# Patient Record
Sex: Male | Born: 1975 | Race: Black or African American | Hispanic: No | Marital: Married | Smoking: Current some day smoker
Health system: Southern US, Community
[De-identification: ages and names within clinical notes are randomized; demographics above are authoritative.]

## PROBLEM LIST (undated history)

## (undated) DIAGNOSIS — F1020 Alcohol dependence, uncomplicated: Secondary | ICD-10-CM

## (undated) DIAGNOSIS — E7800 Pure hypercholesterolemia, unspecified: Secondary | ICD-10-CM

## (undated) DIAGNOSIS — I10 Essential (primary) hypertension: Secondary | ICD-10-CM

## (undated) HISTORY — DX: Alcohol dependence, uncomplicated: F10.20

---

## 1999-03-29 HISTORY — PX: LACERATION REPAIR: SHX5168

## 2002-04-05 ENCOUNTER — Emergency Department (HOSPITAL_COMMUNITY): Admission: EM | Admit: 2002-04-05 | Discharge: 2002-04-05 | Payer: Self-pay | Admitting: Emergency Medicine

## 2003-05-11 ENCOUNTER — Emergency Department (HOSPITAL_COMMUNITY): Admission: AD | Admit: 2003-05-11 | Discharge: 2003-05-11 | Payer: Self-pay | Admitting: Family Medicine

## 2006-02-18 ENCOUNTER — Emergency Department (HOSPITAL_COMMUNITY): Admission: EM | Admit: 2006-02-18 | Discharge: 2006-02-19 | Payer: Self-pay | Admitting: Emergency Medicine

## 2006-02-20 ENCOUNTER — Emergency Department (HOSPITAL_COMMUNITY): Admission: EM | Admit: 2006-02-20 | Discharge: 2006-02-20 | Payer: Self-pay | Admitting: Emergency Medicine

## 2007-07-20 ENCOUNTER — Emergency Department (HOSPITAL_COMMUNITY): Admission: EM | Admit: 2007-07-20 | Discharge: 2007-07-20 | Payer: Self-pay | Admitting: Emergency Medicine

## 2010-12-21 LAB — BASIC METABOLIC PANEL
CO2: 22
Calcium: 9.6
GFR calc Af Amer: >60
Glucose, Bld: 102 — ABNORMAL HIGH
Potassium: 4.2

## 2010-12-21 LAB — URINALYSIS, ROUTINE W REFLEX MICROSCOPIC
Glucose, UA: NEGATIVE
Nitrite: NEGATIVE
Protein, ur: NEGATIVE
Specific Gravity, Urine: 1.009
Urobilinogen, UA: 0.2

## 2010-12-21 LAB — RAPID URINE DRUG SCREEN, HOSP PERFORMED
Barbiturates: NOT DETECTED
Cocaine: POSITIVE — AB
Opiates: NOT DETECTED
Tetrahydrocannabinol: NOT DETECTED

## 2010-12-21 LAB — CBC
Hemoglobin: 14.4
MCHC: 34.5
MCV: 87.1
RBC: 4.78
RDW: 12.7

## 2010-12-21 LAB — DIFFERENTIAL
Basophils Absolute: 0
Basophils Relative: 1
Eosinophils Absolute: 0.2
Neutro Abs: 2.4
Neutrophils Relative %: 42 — ABNORMAL LOW

## 2010-12-21 LAB — POCT CARDIAC MARKERS
CKMB, poc: 2
Myoglobin, poc: 114
Troponin i, poc: <0.05

## 2011-02-21 ENCOUNTER — Emergency Department (HOSPITAL_BASED_OUTPATIENT_CLINIC_OR_DEPARTMENT_OTHER)
Admission: EM | Admit: 2011-02-21 | Discharge: 2011-02-21 | Disposition: A | Discharge status: Home / Self Care | Payer: PRIVATE HEALTH INSURANCE | Attending: Emergency Medicine | Admitting: Emergency Medicine

## 2011-02-21 DIAGNOSIS — B9789 Other viral agents as the cause of diseases classified elsewhere: Secondary | ICD-10-CM | POA: Insufficient documentation

## 2011-02-21 DIAGNOSIS — B349 Viral infection, unspecified: Secondary | ICD-10-CM

## 2011-02-21 DIAGNOSIS — J029 Acute pharyngitis, unspecified: Secondary | ICD-10-CM | POA: Insufficient documentation

## 2011-02-21 HISTORY — DX: Essential (primary) hypertension: I10

## 2011-02-21 NOTE — ED Notes (Signed)
Pt reports a sore throat x 1 week

## 2011-02-21 NOTE — ED Provider Notes (Signed)
History     CSN: [id number] Arrival date & time: 02/21/2011  9:38 AM   First MD Initiated Contact with Patient 02/21/11 [phone]      Chief Complaint  Patient presents with  . Sore Throat    (Consider location/radiation/quality/duration/timing/severity/associated sxs/prior treatment) Patient is a 35 y.o. male presenting with pharyngitis. The history is provided by the patient.  Sore Throat Pertinent negatives include no chest pain, no abdominal pain, no headaches and no shortness of breath.  pt c/o pain bil throat area, bil ears for past few days. Gradual onset. Dull. Constant. occ worse when swallows. No trouble breathing or swallowing. No sinus drainage or pain. No headache. No cough or sob. No neck pain or stiffness. No fever or chills. No known ill contacts.  History reviewed. No pertinent past medical history.  History reviewed. No pertinent past surgical history.  No family history on file.  History  Substance Use Topics  . Smoking status: Not on file  . Smokeless tobacco: Not on file  . Alcohol Use: Not on file      Review of Systems  Constitutional: Negative for fever.  HENT: Negative for rhinorrhea and neck pain.   Eyes: Negative for redness.  Respiratory: Negative for shortness of breath.   Cardiovascular: Negative for chest pain.  Gastrointestinal: Negative for abdominal pain.  Genitourinary: Negative for flank pain.  Musculoskeletal: Negative for back pain.  Skin: Negative for rash.  Neurological: Negative for headaches.  Hematological: Does not bruise/bleed easily.  Psychiatric/Behavioral: Negative for confusion.    Allergies  Review of patient's allergies indicates no known allergies.  Home Medications   Current Outpatient Rx  Name Route Sig Dispense Refill  . OMEGA-3 FATTY ACIDS 1000 MG PO CAPS Oral Take 1 g by mouth daily.      Marland Kitchen HYDROCODONE-ACETAMINOPHEN 5-325 MG PO TABS Oral Take 1 tablet by mouth every 6 (six) hours as needed. For pain     .  IBUPROFEN 200 MG PO TABS Oral Take 400 mg by mouth every 6 (six) hours as needed. For ear and throat pain     . ONE-A-DAY MENS PO Oral Take 1 tablet by mouth.        BP 193/106  Pulse 95  Temp(Src) 98.2 F (36.8 C) (Oral)  Resp 16  Ht 5\' 6"  (1.676 m)  Wt 225 lb (102.059 kg)  BMI 36.32 kg/m2  SpO2 100%  Physical Exam  Nursing note and vitals reviewed. Constitutional: He is oriented to person, place, and time. He appears well-developed and well-nourished. No distress.  HENT:  Head: Atraumatic.       Pharynx erythematous. No exudate. No asymmetric swelling or abscess noted. No trismus. No swelling or tenderness to floor of mouth/submental area.   Eyes: Pupils are equal, round, and reactive to light.  Neck: Neck supple. No tracheal deviation present. No thyromegaly present.       No stiffness or rigidity  Cardiovascular: Normal rate, regular rhythm, normal heart sounds and intact distal pulses.  Exam reveals no gallop and no friction rub.   No murmur heard. Pulmonary/Chest: Effort normal. No accessory muscle usage or stridor. No respiratory distress. He has no rales.  Abdominal: He exhibits no distension. There is no tenderness.       No hsm  Musculoskeletal: Normal range of motion. He exhibits no edema and no tenderness.  Neurological: He is alert and oriented to person, place, and time.  Skin: Skin is warm and dry.  Psychiatric: He has a normal  mood and affect.    ED Course  Procedures (including critical care time)   Labs Reviewed  POCT RAPID STREP A   No results found.   No diagnosis found.    MDM  Strep screen. Recheck bp.   bp improved.       Suzi Roots, MD 02/21/11 [phone]

## 2011-05-05 ENCOUNTER — Ambulatory Visit (INDEPENDENT_AMBULATORY_CARE_PROVIDER_SITE_OTHER): Payer: PRIVATE HEALTH INSURANCE | Admitting: Family Medicine

## 2011-05-05 ENCOUNTER — Ambulatory Visit: Payer: PRIVATE HEALTH INSURANCE

## 2011-05-05 VITALS — BP 120/84 | HR 78 | Temp 98.2°F | Resp 16 | Ht 68.2 in | Wt 242.0 lb

## 2011-05-05 DIAGNOSIS — R7611 Nonspecific reaction to tuberculin skin test without active tuberculosis: Secondary | ICD-10-CM

## 2011-05-05 DIAGNOSIS — R6889 Other general symptoms and signs: Secondary | ICD-10-CM

## 2011-05-05 NOTE — Progress Notes (Signed)
Subjective:    Patient ID: Patrick Levine, male    DOB: 1976-01-25, 36 y.o.   MRN: [id number]  Primary Physician: No primary provider on file.  Chief Complaint: Needs CXR, had positive PPD at [facility]    HPI 36 y.o. y/o [place] American male with history noted below presents needing a CXR. Had a PPD placed on at [facility]. Read as 15 mm induration, and positive. Patient is asymptomatic. No cough, fever, chills, unexplained weight loss, hemoptysis, or fatigue. Never with a positive PPD in the past. Needed the PPD for work. Works in [employer] production pl[employer].     Past Medical History  Diagnosis Date  . Hypertension     Prior to Admission medications   Medication Sig Start Date End Date Taking? Authorizing Provider  fish oil-omega-3 fatty acids 1000 MG capsule Take 1 g by mouth daily.     Yes Historical Provider, MD  Multiple Vitamin (ONE-A-DAY MENS PO) Take 1 tablet by mouth.     Yes Historical Provider, MD  HYDROcodone-acetaminophen (NORCO) 5-325 MG per tablet Take 1 tablet by mouth every 6 (six) hours as needed. For pain    No Historical Provider, MD  ibuprofen (ADVIL,MOTRIN) 200 MG tablet Take 400 mg by mouth every 6 (six) hours as needed. For ear and throat pain    No Historical Provider, MD    No Known Allergies  History   Social History  . Marital Status: Married    Spouse Name: N/A    Number of Children: N/A  . Years of Education: N/A   Social History Main Topics  . Smoking status: Former Smoker -- 0.5 packs/day    Types: Cigarettes  . Smokeless tobacco: Never Used  . Alcohol Use: 1.8 oz/week    3 Cans of beer per week  . Drug Use: No  . Sexually Active: None   Other Topics Concern  . None   Social History Narrative  . None    No family history on file.      Review of Systems  Constitutional: Negative for fever, chills, diaphoresis, activity change, appetite change, fatigue and unexpected weight change.  Respiratory: Negative.     Cardiovascular: Negative for chest pain.       Objective:   Physical Exam  Constitutional: He is oriented to person, place, and time. He appears well-developed and well-nourished. No distress.  HENT:  Head: Normocephalic and atraumatic.  Right Ear: External ear normal.  Left Ear: External ear normal.  Eyes: Conjunctivae are normal. Pupils are equal, round, and reactive to light. Right eye exhibits no discharge. Left eye exhibits no discharge. No scleral icterus.  Neck: Normal range of motion. Neck supple.  Cardiovascular: Normal rate, regular rhythm and normal heart sounds.  Exam reveals no gallop and no friction rub.   No murmur heard. Pulmonary/Chest: Effort normal and breath sounds normal. No respiratory distress. He has no wheezes. He has no rales.  Neurological: He is alert and oriented to person, place, and time.  Skin: He is not diaphoretic.  Psychiatric: He has a normal mood and affect. His behavior is normal. Judgment and thought content normal.   [facility] reading (PRIMARY) by  Dr. Alwyn Ren. Negative. No signs of active TB.    Assessment & Plan:  36 y.o. African American male with a positive PPD, no active TB. - CXR negative, no signs of active disease today in the office - Will refer to TB clinic for evaluation and treatment of positive PPD - RTC  prn  SignedEula Listen, PA-C 05/05/2011 3:02 PM

## 2011-05-05 NOTE — Patient Instructions (Signed)
Tuberculosis Tuberculosis (TB) is a serious infection that can go on for years if not treated. It usually attacks the lungs, but almost any part of the body can be affected. TB germs (bacteria) are easily spread from one person to another through the air. The germs are put into the air when a person with TB coughs or sneezes. You or your child probably caught TB by breathing TB germs into the lungs. This illness generally is not passed to others from clothes, drinking glasses, dishes, handshaking or using the same toilet. TB can be cured with medications. You or your child will have to take the medicines for a long time, often a year or longer. If TB is not treated completely, it can damage the lungs and other parts of the body and may result in death. HOME CARE INSTRUCTIONS   Caregivers are required by law to report all cases of TB to the health department. This helps protect others from getting TB. It also helps individuals get the needed care to cure TB. TREATMENT MUST CONTINUE AS DIRECTED, EVEN IF YOU OR YOUR CHILD NO LONGER FEEL SICK. The health department requires, and enforces this.   IT IS VERY IMPORTANT THAT THE MEDICINE IS TAKEN EXACTLY AS DIRECTED BY THE CAREGIVER. If medicines are skipped or stopped, the infection may not be cured. Bacteria can also become resistant. Remember, you will always have this infection unless treated with correct medications.   Some suggestions to help with remembering to take the medicine include:   Asking someone else, such as a family member or a friend, to help keep track of taking the medicine.   Taking the medicine at the same time every day.   Marking a calendar every time the medicine is taken.   Putting the medicine out at night, for the next day.   Keeping the medicine in a place where the medicine cannot be missed.   Using medication arrangers, which can be purchased at a pharmacy.   Regular follow-up is required to ensure medications are  working. IT IS IMPORTANT TO KEEP ALL APPOINTMENTS. Be sure to tell the caregiver if something is wrong. The follow-up may include:   Weight and temperature measurements and a lung exam.   Chest X-rays to determine the effectiveness of the medications.   Sputum (mucus) samples to determine if TB bacteria are present and if the medications are working.   Until the caregiver says you or your child cannot spread your illness to others (not contagious):   Stay at home. Avoid close contact with others, especially babies and elderly people. They are much more susceptible to this illness.   Cover your mouth and nose with a paper tissue when you cough or sneeze. Dispose of used tissues properly. Masks are not needed.   Wash hands frequently with soap and water.   Do not go back to work or school until the caretaker gives permission. Later, you may work at your same job. If your employer is worried about your illness and your return to work, your caregiver can help eliminate those fears.   Eat a well-balanced diet.   Rest as needed.   Family members, close friends and co-workers should have a TB skin test. They could have caught this illness without getting sick. They may need to take medicine to prevent them from getting TB. People only seen occasionally, probably do not need a TB skin test.   After treatment is finished, regular follow-ups will be needed for  at least 2 years to make sure the illness remains in control.  SEEK MEDICAL CARE IF:   You or your child has new problems (symptoms) that may be caused by the medicine.   Symptoms do not go away or get worse despite taking medications.   Anyone who spent time near you develops symptoms of TB, such as fever, loss of appetite, weight loss, night sweats or cough. They will need to get tested for TB.   You or your child has a cough that does not clear up after 3 or 4 weeks following a cold.   You or your child has an oral temperature above  102 F (38.9 C).   Your baby is older than 3 months with a rectal temperature of 100.5 F (38.1 C) or higher for more than 1 day.   You or your child has continued weight loss despite adequate food intake.  SEEK IMMEDIATE MEDICAL CARE IF:   You or your child has chest pain or cough up blood.   You or your child has difficulty breathing or shortness of breath.   You or your child has a headache, vomiting or neck stiffness.   You or your child has an oral temperature above 102 F (38.9 C), not controlled by medicine.   Your baby is older than 3 months with a rectal temperature of 102 F (38.9 C) or higher.   Your baby is 72 months old or younger with a rectal temperature of 100.4 F (38 C) or higher.  Document Released: 03/11/2000 Document Revised: 10/07/2010 Document Reviewed: 03/16/2008 [facility] Patient Information 2012 [place], [place].

## 2011-12-20 ENCOUNTER — Emergency Department (HOSPITAL_BASED_OUTPATIENT_CLINIC_OR_DEPARTMENT_OTHER)
Admission: EM | Admit: 2011-12-20 | Discharge: 2011-12-20 | Disposition: A | Discharge status: Home / Self Care | Payer: PRIVATE HEALTH INSURANCE | Attending: Emergency Medicine | Admitting: Emergency Medicine

## 2011-12-20 ENCOUNTER — Emergency Department (HOSPITAL_BASED_OUTPATIENT_CLINIC_OR_DEPARTMENT_OTHER): Payer: PRIVATE HEALTH INSURANCE

## 2011-12-20 ENCOUNTER — Encounter (HOSPITAL_BASED_OUTPATIENT_CLINIC_OR_DEPARTMENT_OTHER): Payer: Self-pay | Admitting: Emergency Medicine

## 2011-12-20 DIAGNOSIS — R0602 Shortness of breath: Secondary | ICD-10-CM | POA: Insufficient documentation

## 2011-12-20 DIAGNOSIS — IMO0002 Reserved for concepts with insufficient information to code with codable children: Secondary | ICD-10-CM | POA: Insufficient documentation

## 2011-12-20 DIAGNOSIS — S2231XA Fracture of one rib, right side, initial encounter for closed fracture: Secondary | ICD-10-CM

## 2011-12-20 DIAGNOSIS — R109 Unspecified abdominal pain: Secondary | ICD-10-CM | POA: Insufficient documentation

## 2011-12-20 DIAGNOSIS — R079 Chest pain, unspecified: Secondary | ICD-10-CM | POA: Insufficient documentation

## 2011-12-20 DIAGNOSIS — S2239XA Fracture of one rib, unspecified side, initial encounter for closed fracture: Secondary | ICD-10-CM | POA: Insufficient documentation

## 2011-12-20 LAB — COMPREHENSIVE METABOLIC PANEL
ALT: 39 U/L (ref 0–53)
CO2: 21 mEq/L (ref 19–32)
Calcium: 10 mg/dL (ref 8.4–10.5)
Creatinine, Ser: 1 mg/dL (ref 0.50–1.35)
GFR calc Af Amer: >90 mL/min (ref >90)
GFR calc non Af Amer: >90 mL/min (ref >90)
Glucose, Bld: 124 mg/dL — ABNORMAL HIGH (ref 70–99)
Sodium: 137 mEq/L (ref 135–145)
Total Bilirubin: 0.3 mg/dL (ref 0.3–1.2)

## 2011-12-20 LAB — CBC
Hemoglobin: 14.6 g/dL (ref 13.0–17.0)
MCH: 29.5 pg (ref 26.0–34.0)
MCV: 83.6 fL (ref 78.0–100.0)
RBC: 4.95 MIL/uL (ref 4.22–5.81)

## 2011-12-20 LAB — URINALYSIS, ROUTINE W REFLEX MICROSCOPIC
Bilirubin Urine: NEGATIVE
Specific Gravity, Urine: 1.03 (ref 1.005–1.030)
pH: 6 (ref 5.0–8.0)

## 2011-12-20 LAB — URINE MICROSCOPIC-ADD ON

## 2011-12-20 MED ORDER — OXYCODONE-ACETAMINOPHEN 5-325 MG PO TABS: 1.0000 | ORAL_TABLET | Freq: Four times a day (QID) | ORAL | Status: DC | PRN

## 2011-12-20 MED ORDER — ONDANSETRON HCL 4 MG/2ML IJ SOLN
4.0000 mg | Freq: Once | INTRAMUSCULAR | Status: AC
  Administered 2011-12-20: 4 mg via INTRAVENOUS
  Filled 2011-12-20: qty 2

## 2011-12-20 MED ORDER — IOHEXOL 300 MG/ML  SOLN
100.0000 mL | Freq: Once | INTRAMUSCULAR | Status: AC | PRN
  Administered 2011-12-20: 100 mL via INTRAVENOUS

## 2011-12-20 MED ORDER — MORPHINE SULFATE 4 MG/ML IJ SOLN
4.0000 mg | Freq: Once | INTRAMUSCULAR | Status: AC
  Administered 2011-12-20: 4 mg via INTRAVENOUS
  Filled 2011-12-20: qty 1
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
## 2011-12-26 ENCOUNTER — Telehealth: Payer: Self-pay | Admitting: Family

## 2011-12-26 ENCOUNTER — Encounter: Payer: Self-pay | Admitting: Family

## 2011-12-26 ENCOUNTER — Ambulatory Visit (INDEPENDENT_AMBULATORY_CARE_PROVIDER_SITE_OTHER): Payer: PRIVATE HEALTH INSURANCE | Admitting: Family

## 2011-12-26 VITALS — BP 138/90 | HR 91 | Temp 98.1°F | Resp 16 | Ht 68.0 in | Wt 243.0 lb

## 2011-12-26 DIAGNOSIS — I10 Essential (primary) hypertension: Secondary | ICD-10-CM | POA: Insufficient documentation

## 2011-12-26 DIAGNOSIS — R7611 Nonspecific reaction to tuberculin skin test without active tuberculosis: Secondary | ICD-10-CM | POA: Insufficient documentation

## 2011-12-26 DIAGNOSIS — S2249XA Multiple fractures of ribs, unspecified side, initial encounter for closed fracture: Secondary | ICD-10-CM | POA: Insufficient documentation
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
## 2011-12-26 NOTE — Patient Instructions (Addendum)
You will be contact about your referral to the [facility] health department.  Please let us know if you have not heard back within 1 week about your referral. Please schedule a fasting physical at the front desk for 1 month.
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
## 2011-12-26 NOTE — Telephone Encounter (Signed)
Patrick Levine,  Pt is PPD positive and needs referral to Health Department for treatment of Positive PPD. He is requesting to go to health department in [place]. Could you please help arrange.  I do not see this referral built into [facility]. Thanks.

## 2011-12-26 NOTE — Assessment & Plan Note (Signed)
BP looks ok today off of meds.  Plan to continue low sodium diet and follow up in 1 month for recheck.

## 2011-12-26 NOTE — Assessment & Plan Note (Signed)
Will refer to Health Department for Treatment.

## 2011-12-27 NOTE — Telephone Encounter (Signed)
Called Health Dept   [place]  , spoke to  [employer]    ,called pt he is to call her for instruction phone #'s  Given to pt   [phone] & [phone]  He will call   .

## 2011-12-29 ENCOUNTER — Encounter: Payer: Self-pay | Admitting: *Deleted

## 2011-12-29 ENCOUNTER — Ambulatory Visit (INDEPENDENT_AMBULATORY_CARE_PROVIDER_SITE_OTHER): Payer: PRIVATE HEALTH INSURANCE

## 2011-12-29 DIAGNOSIS — Z23 Encounter for immunization: Secondary | ICD-10-CM

## 2012-01-25 ENCOUNTER — Encounter: Payer: PRIVATE HEALTH INSURANCE | Admitting: Family

## 2012-01-25 DIAGNOSIS — Z0289 Encounter for other administrative examinations: Secondary | ICD-10-CM

## 2012-01-29 ENCOUNTER — Emergency Department (HOSPITAL_COMMUNITY)
Admission: EM | Admit: 2012-01-29 | Discharge: 2012-01-29 | Disposition: A | Discharge status: Home / Self Care | Payer: PRIVATE HEALTH INSURANCE | Attending: Emergency Medicine | Admitting: Emergency Medicine

## 2012-01-29 ENCOUNTER — Encounter (HOSPITAL_COMMUNITY): Payer: Self-pay | Admitting: *Deleted

## 2012-01-29 DIAGNOSIS — F1010 Alcohol abuse, uncomplicated: Secondary | ICD-10-CM | POA: Insufficient documentation

## 2012-01-29 DIAGNOSIS — I10 Essential (primary) hypertension: Secondary | ICD-10-CM | POA: Insufficient documentation

## 2012-01-29 DIAGNOSIS — F10929 Alcohol use, unspecified with intoxication, unspecified: Secondary | ICD-10-CM

## 2012-01-29 DIAGNOSIS — Z87891 Personal history of nicotine dependence: Secondary | ICD-10-CM | POA: Insufficient documentation

## 2012-01-29 LAB — RAPID URINE DRUG SCREEN, HOSP PERFORMED
Amphetamines: NOT DETECTED
Benzodiazepines: NOT DETECTED
Opiates: NOT DETECTED
Tetrahydrocannabinol: NOT DETECTED

## 2012-01-29 MED ORDER — AMMONIA AROMATIC IN INHA
RESPIRATORY_TRACT | Status: AC
  Filled 2012-01-29: qty 10

## 2012-01-29 MED ORDER — SODIUM CHLORIDE 0.9 % IV BOLUS (SEPSIS)
1000.0000 mL | Freq: Once | INTRAVENOUS | Status: AC
  Administered 2012-01-29: 1000 mL via INTRAVENOUS

## 2012-01-29 MED ORDER — ONDANSETRON HCL 4 MG/2ML IJ SOLN
4.0000 mg | Freq: Once | INTRAMUSCULAR | Status: AC
  Administered 2012-01-29: 4 mg via INTRAVENOUS
  Filled 2012-01-29: qty 2

## 2012-01-29 MED ORDER — PANTOPRAZOLE SODIUM 40 MG IV SOLR
40.0000 mg | Freq: Once | INTRAVENOUS | Status: AC
  Administered 2012-01-29: 40 mg via INTRAVENOUS
  Filled 2012-01-29: qty 40
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
## 2012-01-29 NOTE — ED Notes (Signed)
Pt alert and oriented x4. Respirations even and unlabored, bilateral symmetrical rise and fall of chest. Skin warm and dry. In no acute distress. Denies needs.   

## 2012-01-29 NOTE — ED Notes (Signed)
Pt escorted to d/c window. Verbalized understanding d/c instructions. In no acute distress. Ride was coming to pick pt up.
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
## 2012-04-30 ENCOUNTER — Emergency Department (HOSPITAL_BASED_OUTPATIENT_CLINIC_OR_DEPARTMENT_OTHER)
Admission: EM | Admit: 2012-04-30 | Discharge: 2012-04-30 | Disposition: A | Discharge status: Home / Self Care | Payer: PRIVATE HEALTH INSURANCE | Attending: Emergency Medicine | Admitting: Emergency Medicine

## 2012-04-30 ENCOUNTER — Encounter (HOSPITAL_BASED_OUTPATIENT_CLINIC_OR_DEPARTMENT_OTHER): Payer: Self-pay | Admitting: *Deleted

## 2012-04-30 DIAGNOSIS — I10 Essential (primary) hypertension: Secondary | ICD-10-CM | POA: Insufficient documentation

## 2012-04-30 DIAGNOSIS — Z79899 Other long term (current) drug therapy: Secondary | ICD-10-CM | POA: Insufficient documentation

## 2012-04-30 DIAGNOSIS — F17200 Nicotine dependence, unspecified, uncomplicated: Secondary | ICD-10-CM | POA: Insufficient documentation

## 2012-04-30 DIAGNOSIS — J020 Streptococcal pharyngitis: Secondary | ICD-10-CM | POA: Insufficient documentation

## 2012-04-30 MED ORDER — PENICILLIN G BENZATHINE 1200000 UNIT/2ML IM SUSP
1.2000 10*6.[IU] | Freq: Once | INTRAMUSCULAR | Status: AC
  Administered 2012-04-30: 1.2 10*6.[IU] via INTRAMUSCULAR
  Filled 2012-04-30: qty 2

## 2012-04-30 MED ORDER — HYDROCODONE-ACETAMINOPHEN 5-325 MG PO TABS: 2.0000 | ORAL_TABLET | ORAL | Status: DC | PRN

## 2012-04-30 NOTE — ED Provider Notes (Signed)
Medical screening examination/treatment/procedure(s) were performed by non-physician practitioner and as supervising physician I was immediately available for consultation/collaboration.  Gerhard Munch, MD 04/30/12 [phone]

## 2012-04-30 NOTE — ED Provider Notes (Signed)
History     CSN: [id number]  Arrival date & time 04/30/12  1335   First MD Initiated Contact with Patient 04/30/12 1425      Chief Complaint  Patient presents with  . Sore Throat    (Consider location/radiation/quality/duration/timing/severity/associated sxs/prior treatment) Patient is a 37 y.o. male presenting with pharyngitis. The history is provided by the patient. No language interpreter was used.  Sore Throat This is a new problem. The current episode started yesterday. The problem occurs daily. The problem has been unchanged. Associated symptoms include a sore throat. Nothing aggravates the symptoms. He has tried nothing for the symptoms. The treatment provided moderate relief.    Past Medical History  Diagnosis Date  . Hypertension     Past Surgical History  Procedure Date  . Laceration repair 2001    lip laceration--stitches.    Family History  Problem Relation Age of Onset  . Hypertension Father     History  Substance Use Topics  . Smoking status: Former Smoker -- 0.5 packs/day    Types: Cigarettes  . Smokeless tobacco: Never Used  . Alcohol Use: 0.0 oz/week     Comment: "some"      Review of Systems  HENT: Positive for sore throat.     Allergies  Review of patient's allergies indicates no known allergies.  Home Medications   Current Outpatient Rx  Name  Route  Sig  Dispense  Refill  . OMEGA-3 FATTY ACIDS 1000 MG PO CAPS   Oral   Take 1 g by mouth daily.           . IBUPROFEN 200 MG PO TABS   Oral   Take 400 mg by mouth every 6 (six) hours as needed. For ear and throat pain          . ONE-A-DAY MENS PO   Oral   Take 1 tablet by mouth.             BP 158/90  Pulse 117  Temp 100.6 F (38.1 C) (Oral)  Resp 22  SpO2 100%  Physical Exam  Nursing note and vitals reviewed. Constitutional: He is oriented to person, place, and time. He appears well-developed and well-nourished.  HENT:  Head: Normocephalic and atraumatic.  Right  Ear: External ear normal.  Left Ear: External ear normal.       Swollen erythematous pharynx  Eyes: Pupils are equal, round, and reactive to light.  Neck: Normal range of motion. Neck supple.  Cardiovascular: Normal rate and regular rhythm.   Pulmonary/Chest: Effort normal.  Abdominal: Soft.  Musculoskeletal: Normal range of motion.  Neurological: He is alert and oriented to person, place, and time. He has normal reflexes.  Skin: Skin is warm.  Psychiatric: He has a normal mood and affect.    ED Course  Procedures (including critical care time)  Labs Reviewed  RAPID STREP SCREEN - Abnormal; Notable for the following:    Streptococcus, Group A Screen (Direct) POSITIVE (*)     All other components within normal limits   No results found.   No diagnosis found.    MDM   Results for orders placed during the hospital encounter of 04/30/12  RAPID STREP SCREEN      Component Value Range   Streptococcus, Group A Screen (Direct) POSITIVE (*) NEGATIVE   No results found.  bicillian im,  Hydrocodone #10       Lonia Skinner [place], [place] 04/30/12 [phone]

## 2012-04-30 NOTE — ED Notes (Signed)
Sore throat, ear pain, and low grade fever.

## 2012-05-02 ENCOUNTER — Emergency Department (HOSPITAL_BASED_OUTPATIENT_CLINIC_OR_DEPARTMENT_OTHER)
Admission: EM | Admit: 2012-05-02 | Discharge: 2012-05-02 | Disposition: A | Discharge status: Home / Self Care | Payer: PRIVATE HEALTH INSURANCE | Attending: Emergency Medicine | Admitting: Emergency Medicine

## 2012-05-02 ENCOUNTER — Encounter (HOSPITAL_BASED_OUTPATIENT_CLINIC_OR_DEPARTMENT_OTHER): Payer: Self-pay | Admitting: *Deleted

## 2012-05-02 ENCOUNTER — Emergency Department (HOSPITAL_BASED_OUTPATIENT_CLINIC_OR_DEPARTMENT_OTHER): Payer: PRIVATE HEALTH INSURANCE

## 2012-05-02 DIAGNOSIS — Z87891 Personal history of nicotine dependence: Secondary | ICD-10-CM | POA: Insufficient documentation

## 2012-05-02 DIAGNOSIS — Z79899 Other long term (current) drug therapy: Secondary | ICD-10-CM | POA: Insufficient documentation

## 2012-05-02 DIAGNOSIS — I10 Essential (primary) hypertension: Secondary | ICD-10-CM | POA: Insufficient documentation

## 2012-05-02 DIAGNOSIS — J020 Streptococcal pharyngitis: Secondary | ICD-10-CM | POA: Insufficient documentation

## 2012-05-02 LAB — BASIC METABOLIC PANEL
Calcium: 10.1 mg/dL (ref 8.4–10.5)
Creatinine, Ser: 1.2 mg/dL (ref 0.50–1.35)
GFR calc Af Amer: 88 mL/min — ABNORMAL LOW (ref >90)
GFR calc non Af Amer: 76 mL/min — ABNORMAL LOW (ref >90)
Sodium: 140 mEq/L (ref 135–145)

## 2012-05-02 LAB — CBC WITH DIFFERENTIAL/PLATELET
Basophils Absolute: 0 10*3/uL (ref 0.0–0.1)
Basophils Relative: 0 % (ref 0–1)
Eosinophils Absolute: 0 10*3/uL (ref 0.0–0.7)
Eosinophils Relative: 0 % (ref 0–5)
Lymphocytes Relative: 8 % — ABNORMAL LOW (ref 12–46)
MCH: 29 pg (ref 26.0–34.0)
MCHC: 33.2 g/dL (ref 30.0–36.0)
MCV: 87.5 fL (ref 78.0–100.0)
Monocytes Absolute: 1.3 10*3/uL — ABNORMAL HIGH (ref 0.1–1.0)
Platelets: 227 10*3/uL (ref 150–400)
RDW: 13.8 % (ref 11.5–15.5)
WBC: 13.1 10*3/uL — ABNORMAL HIGH (ref 4.0–10.5)

## 2012-05-02 MED ORDER — CLINDAMYCIN HCL 150 MG PO CAPS: 300.0000 mg | ORAL_CAPSULE | Freq: Four times a day (QID) | ORAL | Status: DC

## 2012-05-02 MED ORDER — HYDROCODONE-ACETAMINOPHEN 7.5-325 MG/15ML PO SOLN: 15.0000 mL | Freq: Four times a day (QID) | ORAL | Status: DC | PRN

## 2012-05-02 MED ORDER — IOHEXOL 300 MG/ML  SOLN
76.0000 mL | Freq: Once | INTRAMUSCULAR | Status: AC | PRN
  Administered 2012-05-02: 76 mL via INTRAVENOUS

## 2012-05-02 MED ORDER — DEXAMETHASONE SODIUM PHOSPHATE 10 MG/ML IJ SOLN
10.0000 mg | Freq: Once | INTRAMUSCULAR | Status: AC
  Administered 2012-05-02: 10 mg via INTRAVENOUS
  Filled 2012-05-02: qty 1

## 2012-05-02 MED ORDER — SODIUM CHLORIDE 0.9 % IV SOLN
3.0000 g | Freq: Once | INTRAVENOUS | Status: AC
  Administered 2012-05-02: 3 g via INTRAVENOUS
  Filled 2012-05-02: qty 3

## 2012-05-02 MED ORDER — SODIUM CHLORIDE 0.9 % IV BOLUS (SEPSIS)
1000.0000 mL | Freq: Once | INTRAVENOUS | Status: AC
  Administered 2012-05-02: 1000 mL via INTRAVENOUS

## 2012-05-02 MED ORDER — MORPHINE SULFATE 4 MG/ML IJ SOLN
4.0000 mg | Freq: Once | INTRAMUSCULAR | Status: AC
  Administered 2012-05-02: 4 mg via INTRAVENOUS
  Filled 2012-05-02: qty 1

## 2012-05-02 NOTE — ED Notes (Signed)
Scheduled pt to see dr Suszanne Conners this Friday at 120 pm bring insurance card also have dr Suszanne Conners give extended work note if he feels it is advised to remain out of work longer than Friday feb 8

## 2012-05-02 NOTE — ED Notes (Signed)
Pt seen here on Monday diagnosed with strept throat given Bicillin injection and hydrocodone prescription returns today stating he isnt any better.

## 2012-05-02 NOTE — ED Provider Notes (Signed)
History     CSN: [id number]  Arrival date & time 05/02/12  0801   First MD Initiated Contact with Patient 05/02/12 0801      No chief complaint on file.   (Consider location/radiation/quality/duration/timing/severity/associated sxs/prior treatment) HPI Pt recently diagnosed with strep pharyngitis, given Bicillin IM but symptoms not improving. He has continued to have severe sore throat, R>L, worse with swallowing, hurts to speak and unable to open his mouth.   Past Medical History  Diagnosis Date  . Hypertension     Past Surgical History  Procedure Date  . Laceration repair 2001    lip laceration--stitches.    Family History  Problem Relation Age of Onset  . Hypertension Father     History  Substance Use Topics  . Smoking status: Former Smoker -- 0.5 packs/day    Types: Cigarettes  . Smokeless tobacco: Never Used  . Alcohol Use: 0.0 oz/week     Comment: "some"      Review of Systems All other systems reviewed and are negative except as noted in HPI.   Allergies  Review of patient's allergies indicates no known allergies.  Home Medications   Current Outpatient Rx  Name  Route  Sig  Dispense  Refill  . OMEGA-3 FATTY ACIDS 1000 MG PO CAPS   Oral   Take 1 g by mouth daily.           Marland Kitchen HYDROCODONE-ACETAMINOPHEN 5-325 MG PO TABS   Oral   Take 2 tablets by mouth every 4 (four) hours as needed for pain.   10 tablet   0   . IBUPROFEN 200 MG PO TABS   Oral   Take 400 mg by mouth every 6 (six) hours as needed. For ear and throat pain          . ONE-A-DAY MENS PO   Oral   Take 1 tablet by mouth.             BP 156/91  Pulse 108  Temp 99 F (37.2 C) (Oral)  Resp 20  SpO2 98%  Physical Exam  Nursing note and vitals reviewed. Constitutional: He is oriented to person, place, and time. He appears well-developed and well-nourished.  HENT:  Head: Normocephalic and atraumatic.       Moderate trismus, tonsils 3+ no uvular deviation, swelling and  erythematous tonsillar pillars  Eyes: EOM are normal. Pupils are equal, round, and reactive to light.  Neck: Normal range of motion. Neck supple.  Cardiovascular: Normal rate, normal heart sounds and intact distal pulses.   Pulmonary/Chest: Effort normal and breath sounds normal. No stridor.  Abdominal: Bowel sounds are normal. He exhibits no distension. There is no tenderness.  Musculoskeletal: Normal range of motion. He exhibits no edema and no tenderness.  Lymphadenopathy:    He has cervical adenopathy.  Neurological: He is alert and oriented to person, place, and time. He has normal strength. No cranial nerve deficit or sensory deficit.  Skin: Skin is warm and dry. No rash noted.  Psychiatric: He has a normal mood and affect.    ED Course  Procedures (including critical care time)  Labs Reviewed  CBC WITH DIFFERENTIAL - Abnormal; Notable for the following:    WBC 13.1 (*)     Neutrophils Relative 82 (*)     Neutro Abs 10.7 (*)     Lymphocytes Relative 8 (*)     Monocytes Absolute 1.3 (*)     All other components within normal limits  BASIC  METABOLIC PANEL - Abnormal; Notable for the following:    Glucose, Bld 124 (*)     GFR calc non Af Amer 76 (*)     GFR calc Af Amer 88 (*)     All other components within normal limits   Ct Soft Tissue Neck W Contrast  05/02/2012  *RADIOLOGY REPORT*  Clinical Data: Strep throat.  Peritonsillar abscess.  CT NECK WITH CONTRAST  Technique:  Multidetector CT imaging of the neck was performed with intravenous contrast.  Contrast: 76mL OMNIPAQUE IOHEXOL 300 MG/ML  SOLN  Comparison: None.  Findings: There is extensive soft tissue swelling involving the palatine tonsils bilaterally.  The lingual tonsil is also enlarged bilaterally. Soft tissue swelling extends into the hypopharynx bilaterally.  Multiple small loculated fluid collections are present in the peritonsillar soft tissues on the right.  These all measure up to 1 cm.  These fluid collections  extend down to the hypopharynx on the right.  There is also a 1 cm fluid collection in the left hypopharynx which is consistent with  a small abscess. Adenoid tissue is enlarged symmetrically without abscess.  Mild narrowing of the pharyngeal airway due to soft tissue swelling.  Cervical adenopathy is present left greater than right.  Right level II lymph node measures 15 mm in diameter.  Below this is a 16 mm node.  On the left, there is an 18 mm level II node.  Below this is a 16 x 20 mm node.  There are sub centimeter posterior level V nodes bilaterally.  The larynx is normal.  Lung apices are clear.  Thyroid is normal. Submandibular and parotid glands are normal.  Mucosal thickening in the maxillary sinus bilaterally with probable mucous retention cyst on the left.  Dental caries are present.  IMPRESSION: Extensive soft tissue swelling involving the palatine tonsils and lingual tonsils.  There is soft tissue swelling extending in the hypopharynx.  This is greater on the right than the left.  There are multiple small abscesses on the right and a small 1 cm abscess on the left involving the hypopharynx.  There is cervical adenopathy bilaterally which is most likely reactive due to pharyngitis.   Original Report Authenticated By: Janeece Riggers, M.D.      No diagnosis found.    MDM  Suspect peritonsillar abscess vs worsening phlegmon. Will give IV Unasyn, decadron, IVF and check CT neck.   10:04 AM CT as above, small abscesses, but unlikely to be able to drain. Discussed with Dr. Suszanne Conners who agrees with ED eval, recommends an additional 10mg  of Decadron, oral Clindamycin at home and close follow up in his office.        Charles B. Bernette Mayers, MD 05/02/12 1005

## 2014-02-13 ENCOUNTER — Encounter (HOSPITAL_BASED_OUTPATIENT_CLINIC_OR_DEPARTMENT_OTHER): Payer: Self-pay | Admitting: Emergency Medicine

## 2014-02-13 ENCOUNTER — Emergency Department (HOSPITAL_BASED_OUTPATIENT_CLINIC_OR_DEPARTMENT_OTHER)
Admission: EM | Admit: 2014-02-13 | Discharge: 2014-02-13 | Disposition: A | Discharge status: Home / Self Care | Payer: Managed Care, Other (non HMO) | Attending: Emergency Medicine | Admitting: Emergency Medicine

## 2014-02-13 DIAGNOSIS — I10 Essential (primary) hypertension: Secondary | ICD-10-CM | POA: Insufficient documentation

## 2014-02-13 DIAGNOSIS — Z202 Contact with and (suspected) exposure to infections with a predominantly sexual mode of transmission: Secondary | ICD-10-CM | POA: Diagnosis not present

## 2014-02-13 DIAGNOSIS — Z79899 Other long term (current) drug therapy: Secondary | ICD-10-CM | POA: Insufficient documentation

## 2014-02-13 DIAGNOSIS — Z87891 Personal history of nicotine dependence: Secondary | ICD-10-CM | POA: Diagnosis not present

## 2014-02-13 DIAGNOSIS — IMO0001 Reserved for inherently not codable concepts without codable children: Secondary | ICD-10-CM

## 2014-02-13 DIAGNOSIS — N4889 Other specified disorders of penis: Secondary | ICD-10-CM | POA: Diagnosis present

## 2014-02-13 DIAGNOSIS — Z711 Person with feared health complaint in whom no diagnosis is made: Secondary | ICD-10-CM

## 2014-02-13 DIAGNOSIS — R300 Dysuria: Secondary | ICD-10-CM | POA: Diagnosis not present

## 2014-02-13 DIAGNOSIS — R030 Elevated blood-pressure reading, without diagnosis of hypertension: Secondary | ICD-10-CM

## 2014-02-13 LAB — URINALYSIS, ROUTINE W REFLEX MICROSCOPIC
Bilirubin Urine: NEGATIVE
GLUCOSE, UA: NEGATIVE mg/dL
KETONES UR: NEGATIVE mg/dL
LEUKOCYTES UA: NEGATIVE
NITRITE: NEGATIVE
PH: 6.5 (ref 5.0–8.0)
Protein, ur: NEGATIVE mg/dL
Specific Gravity, Urine: 1.02 (ref 1.005–1.030)
Urobilinogen, UA: 0.2 mg/dL (ref 0.0–1.0)

## 2014-02-13 LAB — URINE MICROSCOPIC-ADD ON

## 2014-02-13 MED ORDER — CEFTRIAXONE SODIUM 250 MG IJ SOLR
250.0000 mg | Freq: Once | INTRAMUSCULAR | Status: AC
  Administered 2014-02-13: 250 mg via INTRAMUSCULAR
  Filled 2014-02-13: qty 250

## 2014-02-13 MED ORDER — AZITHROMYCIN 250 MG PO TABS
1000.0000 mg | ORAL_TABLET | Freq: Once | ORAL | Status: AC
  Administered 2014-02-13: 1000 mg via ORAL
  Filled 2014-02-13: qty 4

## 2014-02-13 MED ORDER — LIDOCAINE HCL (PF) 1 % IJ SOLN
INTRAMUSCULAR | Status: AC
  Administered 2014-02-13: 5 mL
  Filled 2014-02-13: qty 5

## 2014-02-13 NOTE — ED Notes (Signed)
Pt having some irritation at penis on Tuesday.  Pt had encounter on Monday.  No fever or back pain.

## 2014-02-13 NOTE — Discharge Instructions (Signed)
You were treated today for both gonorrhea and Chlamydia. If these tests results positive, you'll be contacted and are then obligated to inform your partner.   Dysuria Dysuria is the medical term for pain with urination. There are many causes for dysuria, but urinary tract infection is the most common. If a urinalysis was performed it can show that there is a urinary tract infection. A urine culture confirms that you or your child is sick. You will need to follow up with a healthcare provider because:  If a urine culture was done you will need to know the culture results and treatment recommendations.  If the urine culture was positive, you or your child will need to be put on antibiotics or know if the antibiotics prescribed are the right antibiotics for your urinary tract infection.  If the urine culture is negative (no urinary tract infection), then other causes may need to be explored or antibiotics need to be stopped. Today laboratory work may have been done and there does not seem to be an infection. If cultures were done they will take at least 24 to 48 hours to be completed. Today x-rays may have been taken and they read as normal. No cause can be found for the problems. The x-rays may be re-read by a radiologist and you will be contacted if additional findings are made. You or your child may have been put on medications to help with this problem until you can see your primary caregiver. If the problems get better, see your primary caregiver if the problems return. If you were given antibiotics (medications which kill germs), take all of the mediations as directed for the full course of treatment.  If laboratory work was done, you need to find the results. Leave a telephone number where you can be reached. If this is not possible, make sure you find out how you are to get test results. HOME CARE INSTRUCTIONS   Drink lots of fluids. For adults, drink eight, 8 ounce glasses of clear juice or  water a day. For children, replace fluids as suggested by your caregiver.  Empty the bladder often. Avoid holding urine for long periods of time.  After a bowel movement, women should cleanse front to back, using each tissue only once.  Empty your bladder before and after sexual intercourse.  Take all the medicine given to you until it is gone. You may feel better in a few days, but TAKE ALL MEDICINE.  Avoid caffeine, tea, alcohol and carbonated beverages, because they tend to irritate the bladder.  In men, alcohol may irritate the prostate.  Only take over-the-counter or prescription medicines for pain, discomfort, or fever as directed by your caregiver.  If your caregiver has given you a follow-up appointment, it is very important to keep that appointment. Not keeping the appointment could result in a chronic or permanent injury, pain, and disability. If there is any problem keeping the appointment, you must call back to this facility for assistance. SEEK IMMEDIATE MEDICAL CARE IF:   Back pain develops.  A fever develops.  There is nausea (feeling sick to your stomach) or vomiting (throwing up).  Problems are no better with medications or are getting worse. MAKE SURE YOU:   Understand these instructions.  Will watch your condition.  Will get help right away if you are not doing well or get worse. Document Released: 12/11/2003 Document Revised: 06/06/2011 Document Reviewed: 10/18/2007 [facility] Patient Information 2015 [place], [place]. This information is not intended to  replace advice given to you by your health care provider. Make sure you discuss any questions you have with your health care provider. Sexually Transmitted Disease A sexually transmitted disease (STD) is a disease or infection that may be passed (transmitted) from person to person, usually during sexual activity. This may happen by way of saliva, semen, blood, vaginal mucus, or urine. Common STDs include:    Gonorrhea.   Chlamydia.   Syphilis.   HIV and AIDS.   Genital herpes.   Hepatitis B and C.   Trichomonas.   Human papillomavirus (HPV).   Pubic lice.   Scabies.  Mites.  Bacterial vaginosis. WHAT ARE CAUSES OF STDs? An STD may be caused by bacteria, a virus, or parasites. STDs are often transmitted during sexual activity if one person is infected. However, they may also be transmitted through nonsexual means. STDs may be transmitted after:   Sexual intercourse with an infected person.   Sharing sex toys with an infected person.   Sharing needles with an infected person or using unclean piercing or tattoo needles.  Having intimate contact with the genitals, mouth, or rectal areas of an infected person.   Exposure to infected fluids during birth. WHAT ARE THE SIGNS AND SYMPTOMS OF STDs? Different STDs have different symptoms. Some people may not have any symptoms. If symptoms are present, they may include:   Painful or bloody urination.   Pain in the pelvis, abdomen, vagina, anus, throat, or eyes.   A skin rash, itching, or irritation.  Growths, ulcerations, blisters, or sores in the genital and anal areas.  Abnormal vaginal discharge with or without bad odor.   Penile discharge in men.   Fever.   Pain or bleeding during sexual intercourse.   Swollen glands in the groin area.   Yellow skin and eyes (jaundice). This is seen with hepatitis.   Swollen testicles.  Infertility.  Sores and blisters in the mouth. HOW ARE STDs DIAGNOSED? To make a diagnosis, your health care provider may:   Take a medical history.   Perform a physical exam.   Take a sample of any discharge to examine.  Swab the throat, cervix, opening to the penis, rectum, or vagina for testing.  Test a sample of your first morning urine.   Perform blood tests.   Perform a Pap test, if this applies.   Perform a colposcopy.   Perform a laparoscopy.   HOW ARE STDs TREATED? Treatment depends on the STD. Some STDs may be treated but not cured.   Chlamydia, gonorrhea, trichomonas, and syphilis can be cured with antibiotic medicine.   Genital herpes, hepatitis, and HIV can be treated, but not cured, with prescribed medicines. The medicines lessen symptoms.   Genital warts from HPV can be treated with medicine or by freezing, burning (electrocautery), or surgery. Warts may come back.   HPV cannot be cured with medicine or surgery. However, abnormal areas may be removed from the cervix, vagina, or vulva.   If your diagnosis is confirmed, your recent sexual partners need treatment. This is true even if they are symptom-free or have a negative culture or evaluation. They should not have sex until their health care providers say it is okay. HOW CAN I REDUCE MY RISK OF GETTING AN STD? Take these steps to reduce your risk of getting an STD:  Use latex condoms, dental dams, and water-soluble lubricants during sexual activity. Do not use petroleum jelly or oils.  Avoid having multiple sex partners.  Do not  have sex with someone who has other sex partners.  Do not have sex with anyone you do not know or who is at high risk for an STD.  Avoid risky sex practices that can break your skin.  Do not have sex if you have open sores on your mouth or skin.  Avoid drinking too much alcohol or taking illegal drugs. Alcohol and drugs can affect your judgment and put you in a vulnerable position.  Avoid engaging in oral and anal sex acts.  Get vaccinated for HPV and hepatitis. If you have not received these vaccines in the past, talk to your health care provider about whether one or both might be right for you.   If you are at risk of being infected with HIV, it is recommended that you take a prescription medicine daily to prevent HIV infection. This is called pre-exposure prophylaxis (PrEP). You are considered at risk if:  You are a man who has  sex with other men (MSM).  You are a heterosexual man or woman and are sexually active with more than one partner.  You take drugs by injection.  You are sexually active with a partner who has HIV.  Talk with your health care provider about whether you are at high risk of being infected with HIV. If you choose to begin PrEP, you should first be tested for HIV. You should then be tested every 3 months for as long as you are taking PrEP.  WHAT SHOULD I DO IF I THINK I HAVE AN STD?  See your health care provider.   Tell your sexual partner(s). They should be tested and treated for any STDs.  Do not have sex until your health care provider says it is okay. WHEN SHOULD I GET IMMEDIATE MEDICAL CARE? Contact your health care provider right away if:   You have severe abdominal pain.  You are a man and notice swelling or pain in your testicles.  You are a woman and notice swelling or pain in your vagina. Document Released: 06/04/2002 Document Revised: 03/19/2013 Document Reviewed: 10/02/2012 [facility] Patient Information 2015 [place], [place]. This information is not intended to replace advice given to you by your health care provider. Make sure you discuss any questions you have with your health care provider.

## 2014-02-13 NOTE — ED Provider Notes (Signed)
CSN: [id number]     Arrival date & time 02/13/14  1046 History   First MD Initiated Contact with Patient 02/13/14 1256     Chief Complaint  Patient presents with  . Penis Pain     (Consider location/radiation/quality/duration/timing/severity/associated sxs/prior Treatment) HPI Comments: This is a 38 year old male who presents to the emergency department with concerns of sexually transmitted infection. Patient reports 4 days ago he had unprotected intercourse with a new partner. The next day he started to develop some irritation at the tip of his penis and a burning sensation when urinating. Denies penile discharge or swelling. Denies testicular pain or swelling. Denies abdominal pain, nausea, vomiting or fevers.  Patient is a 38 y.o. male presenting with penile pain. The history is provided by the patient.  Penis Pain    Past Medical History  Diagnosis Date  . Hypertension    Past Surgical History  Procedure Laterality Date  . Laceration repair  2001    lip laceration--stitches.   Family History  Problem Relation Age of Onset  . Hypertension Father    History  Substance Use Topics  . Smoking status: Former Smoker -- 0.50 packs/day    Types: Cigarettes  . Smokeless tobacco: Never Used  . Alcohol Use: 0.0 oz/week     Comment: "some"    Review of Systems  Genitourinary: Positive for dysuria and penile pain.  All other systems reviewed and are negative.     Allergies  Review of patient's allergies indicates no known allergies.  Home Medications   Prior to Admission medications   Medication Sig Start Date End Date Taking? Authorizing Provider  hydrochlorothiazide (HYDRODIURIL) 12.5 MG tablet Take 12.5 mg by mouth daily.   Yes Historical Provider, MD  Multiple Vitamin (ONE-A-DAY MENS PO) Take 1 tablet by mouth.     Yes Historical Provider, MD  fish oil-omega-3 fatty acids 1000 MG capsule Take 1 g by mouth daily.      Historical Provider, MD   BP 171/105 mmHg   Pulse 80  Temp(Src) 98.9 F (37.2 C) (Oral)  Resp 19  Ht 5\' 7"  (1.702 m)  Wt 245 lb (111.131 kg)  BMI 38.36 kg/m2  SpO2 99% Physical Exam  Constitutional: He is oriented to person, place, and time. He appears well-developed and well-nourished. No distress.  HENT:  Head: Normocephalic and atraumatic.  Eyes: Conjunctivae and EOM are normal.  Neck: Normal range of motion. Neck supple.  Cardiovascular: Normal rate, regular rhythm and normal heart sounds.   Pulmonary/Chest: Effort normal and breath sounds normal.  Genitourinary: Right testis shows no mass, no swelling and no tenderness. Left testis shows no mass, no swelling and no tenderness. No penile erythema or penile tenderness. No discharge found.  Musculoskeletal: Normal range of motion. He exhibits no edema.  Neurological: He is alert and oriented to person, place, and time.  Skin: Skin is warm and dry.  Psychiatric: He has a normal mood and affect. His behavior is normal.  Nursing note and vitals reviewed.   ED Course  Procedures (including critical care time) Labs Review Labs Reviewed  URINALYSIS, ROUTINE W REFLEX MICROSCOPIC - Abnormal; Notable for the following:    Hgb urine dipstick SMALL (*)    All other components within normal limits  GC/CHLAMYDIA PROBE AMP  URINE MICROSCOPIC-ADD ON    Imaging Review No results found.   EKG Interpretation None      MDM   Final diagnoses:  Concern about STD in male without diagnosis  Dysuria  Elevated blood pressure   Patient presenting with concerns of sexually transmitted disease. GC/Chlamydia cultures pending. Patient prophylactically treated with Rocephin and azithromycin. Urinalysis negative for infection. Regarding blood pressure, patient reports he did not take his blood pressure medications this morning. He states he has been at home and will take them when he returns home. Asymptomatic high blood pressure. Stable for discharge. F/u with PCP. Return precautions  given. Patient states understanding of treatment care plan and is agreeable.  Carman Ching, PA-C 02/13/14 [place], MD 02/13/14 [phone]

## 2014-02-14 LAB — GC/CHLAMYDIA PROBE AMP
CT Probe RNA: NEGATIVE
GC PROBE AMP APTIMA: NEGATIVE

## 2014-04-11 ENCOUNTER — Encounter: Payer: Self-pay | Admitting: Family

## 2014-04-11 ENCOUNTER — Ambulatory Visit (INDEPENDENT_AMBULATORY_CARE_PROVIDER_SITE_OTHER): Payer: Managed Care, Other (non HMO) | Admitting: Family

## 2014-04-11 ENCOUNTER — Ambulatory Visit (HOSPITAL_BASED_OUTPATIENT_CLINIC_OR_DEPARTMENT_OTHER)
Admission: RE | Admit: 2014-04-11 | Discharge: 2014-04-11 | Disposition: A | Discharge status: Home / Self Care | Payer: Managed Care, Other (non HMO) | Source: Ambulatory Visit | Attending: Family | Admitting: Family

## 2014-04-11 ENCOUNTER — Telehealth: Payer: Self-pay | Admitting: Family

## 2014-04-11 ENCOUNTER — Ambulatory Visit: Payer: PRIVATE HEALTH INSURANCE | Admitting: Family

## 2014-04-11 VITALS — BP 130/78 | HR 71 | Temp 98.1°F | Resp 18 | Ht 67.75 in | Wt 243.6 lb

## 2014-04-11 DIAGNOSIS — N368 Other specified disorders of urethra: Secondary | ICD-10-CM | POA: Insufficient documentation

## 2014-04-11 DIAGNOSIS — Z91030 Bee allergy status: Secondary | ICD-10-CM | POA: Insufficient documentation

## 2014-04-11 DIAGNOSIS — R7611 Nonspecific reaction to tuberculin skin test without active tuberculosis: Secondary | ICD-10-CM | POA: Diagnosis present

## 2014-04-11 DIAGNOSIS — N399 Disorder of urinary system, unspecified: Secondary | ICD-10-CM

## 2014-04-11 DIAGNOSIS — R300 Dysuria: Secondary | ICD-10-CM

## 2014-04-11 DIAGNOSIS — I10 Essential (primary) hypertension: Secondary | ICD-10-CM

## 2014-04-11 DIAGNOSIS — Z91038 Other insect allergy status: Secondary | ICD-10-CM

## 2014-04-11 LAB — BASIC METABOLIC PANEL
BUN: 14 mg/dL (ref 6–23)
CALCIUM: 9.8 mg/dL (ref 8.4–10.5)
CO2: 27 mEq/L (ref 19–32)
Chloride: 106 mEq/L (ref 96–112)
Creatinine, Ser: 1.12 mg/dL (ref 0.40–1.50)
GFR: 93.87 mL/min (ref >60.00)
GLUCOSE: 107 mg/dL — AB (ref 70–99)
POTASSIUM: 4.1 meq/L (ref 3.5–5.1)
Sodium: 138 mEq/L (ref 135–145)

## 2014-04-11 MED ORDER — HYDROCHLOROTHIAZIDE 12.5 MG PO TABS: 12.5000 mg | ORAL_TABLET | Freq: Every day | ORAL | Status: DC

## 2014-04-11 MED ORDER — EPINEPHRINE 0.3 MG/0.3ML IJ SOAJ: 0.3000 mg | Freq: Once | INTRAMUSCULAR | Status: DC

## 2014-04-11 NOTE — Progress Notes (Signed)
Pre visit review using our clinic review tool, if applicable. No additional management support is needed unless otherwise documented below in the visit note. 

## 2014-04-11 NOTE — Assessment & Plan Note (Signed)
Stable on hctz, continue same, obtain bmet.

## 2014-04-11 NOTE — Patient Instructions (Signed)
Please complete lab work prior to leaving. Complete chest x ray on the first floor. You will be contacted about your referral to the health department. Schedule a complete physical at the front desk.

## 2014-04-11 NOTE — Assessment & Plan Note (Signed)
Will obtain urine culture and urine GC/Chlamydia

## 2014-04-11 NOTE — Telephone Encounter (Signed)
Could you please initiate referral to the health department for PPD +.  Pt is having a chest x ray today and this referral is not built into epic.

## 2014-04-11 NOTE — Assessment & Plan Note (Signed)
Advised pt to keep epi pen on hand (refill provided + coupon). Also advised pt to keep liquid benadryl on hand.

## 2014-04-11 NOTE — Progress Notes (Signed)
Subjective:    Patient ID: Patrick Levine, male    DOB: June 26, 1975, 39 y.o.   MRN: [id number]  HPI  HTN- Patient is currently maintained on the following medications for blood pressure HCTZ Patient reports good compliance with blood pressure medications. Patient denies chest pain, shortness of breath or swelling. Last 3 blood pressure readings in our office are as follows:  BP Readings from Last 3 Encounters:  04/11/14 130/78  02/13/14 171/105  05/02/12 148/87    Reports that he stopped alcohol. Was drinking beer and moonshine.   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   Reports some mild urethral irritation without discharge.  Had one new partner but reports + condom use.      Review of Systems See HPI  Past Medical History  Diagnosis Date  . Hypertension     History   Social History  . Marital Status: Married    Spouse Name: N/A    Number of Children: 4  . Years of Education: N/A   Occupational History  . MENTAL HEALTH/CASE MANAGER    Social History Main Topics  . Smoking status: Current Some Day Smoker -- 0.50 packs/day    Types: Cigarettes  . Smokeless tobacco: Never Used  . Alcohol Use: 0.0 oz/week     Comment: "some"  . Drug Use: No     Comment: remote use of cocaine and marijuana  . Sexual Activity: Not on file   Other Topics Concern  . 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   Married          Past Surgical History  Procedure Laterality Date  . Laceration repair  2001    lip laceration--stitches.    Family History  Problem Relation Age of Onset  . Hypertension Father     Allergies  Allergen Reactions  . Bee Venom Anaphylaxis     Current Outpatient Prescriptions on File Prior to Visit  Medication Sig Dispense Refill  . fish oil-omega-3 fatty acids 1000 MG capsule Take 1 g by mouth daily.      . Multiple Vitamin (ONE-A-DAY MENS PO) Take 1 tablet by mouth.       No current facility-administered medications on file prior to visit.    BP 130/78 mmHg  Pulse 71  Temp(Src) 98.1 F (36.7 C) (Oral)  Resp 18  Ht 5' 7.75" (1.721 m)  Wt 243 lb 9.6 oz (110.496 kg)  BMI 37.31 kg/m2  SpO2 99%        Objective:   Physical Exam  Constitutional: He is oriented to person, place, and time. He appears well-developed and well-nourished. No distress.  HENT:  Head: Normocephalic and atraumatic.  Cardiovascular: Normal rate and regular rhythm.   No murmur heard. Pulmonary/Chest: Effort normal and breath sounds normal. No respiratory distress. He has no wheezes. He has no rales. He exhibits no tenderness.  Musculoskeletal: He exhibits no edema.  Lymphadenopathy:    He has no cervical adenopathy.  Neurological: He is alert and oriented to person, place, and time.  Psychiatric: He has a normal mood and affect. His behavior is normal. Judgment and thought content normal.  Assessment & Plan:

## 2014-04-11 NOTE — Assessment & Plan Note (Signed)
Repeat CXR today, refer to health departmetn.

## 2014-04-12 LAB — GC/CHLAMYDIA PROBE AMP, URINE
CHLAMYDIA, SWAB/URINE, PCR: NEGATIVE
GC Probe Amp, Urine: NEGATIVE

## 2014-04-13 LAB — URINE CULTURE
Colony Count: NO GROWTH
Organism ID, Bacteria: NO GROWTH

## 2014-04-15 NOTE — Telephone Encounter (Signed)
Referral faxed to health department.  Patient notified to call health department and stated understanding.    eal

## 2014-06-11 ENCOUNTER — Encounter: Payer: Managed Care, Other (non HMO) | Admitting: Family

## 2014-06-12 ENCOUNTER — Telehealth: Payer: Self-pay | Admitting: Family

## 2014-06-12 NOTE — Telephone Encounter (Signed)
Yes pls

## 2014-06-12 NOTE — Telephone Encounter (Signed)
Pt was no show for CPE on 06/11/14- rescheduled to 07/18/14. Charge no show?

## 2014-06-27 ENCOUNTER — Telehealth: Payer: Self-pay | Admitting: Family

## 2014-06-27 NOTE — Telephone Encounter (Signed)
Pre visit letter sent  °

## 2014-07-07 ENCOUNTER — Emergency Department (HOSPITAL_BASED_OUTPATIENT_CLINIC_OR_DEPARTMENT_OTHER)
Admission: EM | Admit: 2014-07-07 | Discharge: 2014-07-07 | Disposition: A | Discharge status: Home / Self Care | Payer: Managed Care, Other (non HMO) | Attending: Emergency Medicine | Admitting: Emergency Medicine

## 2014-07-07 ENCOUNTER — Encounter (HOSPITAL_BASED_OUTPATIENT_CLINIC_OR_DEPARTMENT_OTHER): Payer: Self-pay | Admitting: Emergency Medicine

## 2014-07-07 DIAGNOSIS — Z720 Tobacco use: Secondary | ICD-10-CM | POA: Diagnosis not present

## 2014-07-07 DIAGNOSIS — I10 Essential (primary) hypertension: Secondary | ICD-10-CM | POA: Insufficient documentation

## 2014-07-07 DIAGNOSIS — J029 Acute pharyngitis, unspecified: Secondary | ICD-10-CM | POA: Diagnosis present

## 2014-07-07 DIAGNOSIS — J020 Streptococcal pharyngitis: Secondary | ICD-10-CM | POA: Diagnosis not present

## 2014-07-07 DIAGNOSIS — Z79899 Other long term (current) drug therapy: Secondary | ICD-10-CM | POA: Insufficient documentation

## 2014-07-07 LAB — RAPID STREP SCREEN (MED CTR MEBANE ONLY): Streptococcus, Group A Screen (Direct): POSITIVE — AB

## 2014-07-07 MED ORDER — PENICILLIN G BENZATHINE 1200000 UNIT/2ML IM SUSP
1.2000 10*6.[IU] | Freq: Once | INTRAMUSCULAR | Status: AC
  Administered 2014-07-07: 1.2 10*6.[IU] via INTRAMUSCULAR
  Filled 2014-07-07: qty 2

## 2014-07-07 NOTE — ED Provider Notes (Signed)
CSN: [id number]     Arrival date & time 07/07/14  0537 History   First MD Initiated Contact with Patient 07/07/14 [phone]     Chief Complaint  Patient presents with  . Sore Throat     (Consider location/radiation/quality/duration/timing/severity/associated sxs/prior Treatment) Patient is a 39 y.o. male presenting with pharyngitis. The history is provided by the patient.  Sore Throat This is a new problem. The current episode started 2 days ago. The problem occurs constantly. The problem has been rapidly worsening. Pertinent negatives include no chest pain, no abdominal pain and no shortness of breath. The symptoms are aggravated by swallowing. Nothing relieves the symptoms. He has tried nothing for the symptoms. The treatment provided no relief.    Past Medical History  Diagnosis Date  . Hypertension    Past Surgical History  Procedure Laterality Date  . Laceration repair  2001    lip laceration--stitches.   Family History  Problem Relation Age of Onset  . Hypertension Father    History  Substance Use Topics  . Smoking status: Current Some Day Smoker -- 0.50 packs/day    Types: Cigarettes  . Smokeless tobacco: Never Used  . Alcohol Use: 0.0 oz/week     Comment: "some"    Review of Systems  Respiratory: Negative for shortness of breath.   Cardiovascular: Negative for chest pain.  Gastrointestinal: Negative for abdominal pain.  All other systems reviewed and are negative.     Allergies  Bee venom  Home Medications   Prior to Admission medications   Medication Sig Start Date End Date Taking? Authorizing Provider  EPINEPHrine (EPIPEN 2-PAK) 0.3 mg/0.3 mL IJ SOAJ injection Inject 0.3 mLs (0.3 mg total) into the muscle once. 04/11/14   Debbrah Alar, NP  fish oil-omega-3 fatty acids 1000 MG capsule Take 1 g by mouth daily.      Historical Provider, MD  hydrochlorothiazide (HYDRODIURIL) 12.5 MG tablet Take 1 tablet (12.5 mg total) by mouth daily. 04/11/14   Debbrah Alar, NP  Multiple Vitamin (ONE-A-DAY MENS PO) Take 1 tablet by mouth.      Historical Provider, MD   BP 168/100 mmHg  Pulse 88  Temp(Src) 98.6 F (37 C) (Oral)  Resp 20  Ht 5\' 7"  (1.702 m)  Wt 232 lb (105.235 kg)  BMI 36.33 kg/m2  SpO2 100% Physical Exam  Constitutional: He is oriented to person, place, and time. He appears well-developed and well-nourished. No distress.  HENT:  Head: Normocephalic and atraumatic.  There is erythema and exudates on the oropharynx.  Neck: Normal range of motion. Neck supple.  Cardiovascular: Normal rate, regular rhythm and normal heart sounds.   Pulmonary/Chest: Effort normal and breath sounds normal. No respiratory distress. He has no wheezes.  Abdominal: Soft. Bowel sounds are normal.  Musculoskeletal: Normal range of motion. He exhibits no edema.  Lymphadenopathy:    He has cervical adenopathy.  Neurological: He is alert and oriented to person, place, and time.  Skin: Skin is warm and dry. He is not diaphoretic.  Nursing note and vitals reviewed.   ED Course  Procedures (including critical care time) Labs Review Labs Reviewed  RAPID STREP SCREEN - Abnormal; Notable for the following:    Streptococcus, Group A Screen (Direct) POSITIVE (*)    All other components within normal limits    Imaging Review No results found.   EKG Interpretation None      MDM   Final diagnoses:  None    Will treat with bicillin.  Ibuprofen prn.    Veryl Speak, MD 07/07/14 [phone]

## 2014-07-07 NOTE — ED Notes (Signed)
Pt reports severe throat pain onset 1 day ago . States grand daughter has same

## 2014-07-07 NOTE — Discharge Instructions (Signed)
Ibuprofen 600 mg every six hours as needed for pain or fever.  Return to the ER if you develop any new and concerning symptoms.   Strep Throat Strep throat is an infection of the throat caused by a bacteria named Streptococcus pyogenes. Your health care provider may call the infection streptococcal "tonsillitis" or "pharyngitis" depending on whether there are signs of inflammation in the tonsils or back of the throat. Strep throat is most common in children aged 5-15 years during the cold months of the year, but it can occur in people of any age during any season. This infection is spread from person to person (contagious) through coughing, sneezing, or other close contact. SIGNS AND SYMPTOMS   Fever or chills.  Painful, swollen, red tonsils or throat.  Pain or difficulty when swallowing.  White or yellow spots on the tonsils or throat.  Swollen, tender lymph nodes or "glands" of the neck or under the jaw.  Red rash all over the body (rare). DIAGNOSIS  Many different infections can cause the same symptoms. A test must be done to confirm the diagnosis so the right treatment can be given. A "rapid strep test" can help your health care provider make the diagnosis in a few minutes. If this test is not available, a light swab of the infected area can be used for a throat culture test. If a throat culture test is done, results are usually available in a day or two. TREATMENT  Strep throat is treated with antibiotic medicine. HOME CARE INSTRUCTIONS   Gargle with 1 tsp of salt in 1 cup of warm water, 3-4 times per day or as needed for comfort.  Family members who also have a sore throat or fever should be tested for strep throat and treated with antibiotics if they have the strep infection.  Make sure everyone in your household washes their hands well.  Do not share food, drinking cups, or personal items that could cause the infection to spread to others.  You may need to eat a soft food  diet until your sore throat gets better.  Drink enough water and fluids to keep your urine clear or pale yellow. This will help prevent dehydration.  Get plenty of rest.  Stay home from school, day care, or work until you have been on antibiotics for 24 hours.  Take medicines only as directed by your health care provider.  Take your antibiotic medicine as directed by your health care provider. Finish it even if you start to feel better. SEEK MEDICAL CARE IF:   The glands in your neck continue to enlarge.  You develop a rash, cough, or earache.  You cough up green, yellow-brown, or bloody sputum.  You have pain or discomfort not controlled by medicines.  Your problems seem to be getting worse rather than better.  You have a fever. SEEK IMMEDIATE MEDICAL CARE IF:   You develop any new symptoms such as vomiting, severe headache, stiff or painful neck, chest pain, shortness of breath, or trouble swallowing.  You develop severe throat pain, drooling, or changes in your voice.  You develop swelling of the neck, or the skin on the neck becomes red and tender.  You develop signs of dehydration, such as fatigue, dry mouth, and decreased urination.  You become increasingly sleepy, or you cannot wake up completely. MAKE SURE YOU:  Understand these instructions.  Will watch your condition.  Will get help right away if you are not doing well or get worse.  Document Released: 03/11/2000 Document Revised: 07/29/2013 Document Reviewed: 05/13/2010 [facility] Patient Information 2015 [place], [place]. This information is not intended to replace advice given to you by your health care provider. Make sure you discuss any questions you have with your health care provider.

## 2014-07-17 ENCOUNTER — Telehealth: Payer: Self-pay | Admitting: *Deleted

## 2014-07-17 NOTE — Telephone Encounter (Signed)
Patient answered phone for pre-visit call, but was unable to talk at the time.  Patient stated he will call back later today.

## 2014-07-18 ENCOUNTER — Encounter: Payer: Managed Care, Other (non HMO) | Admitting: Family

## 2014-07-18 DIAGNOSIS — Z0289 Encounter for other administrative examinations: Secondary | ICD-10-CM

## 2015-02-08 ENCOUNTER — Emergency Department (HOSPITAL_BASED_OUTPATIENT_CLINIC_OR_DEPARTMENT_OTHER)
Admission: EM | Admit: 2015-02-08 | Discharge: 2015-02-08 | Disposition: A | Discharge status: Home / Self Care | Payer: Managed Care, Other (non HMO) | Attending: Emergency Medicine | Admitting: Emergency Medicine

## 2015-02-08 ENCOUNTER — Encounter (HOSPITAL_BASED_OUTPATIENT_CLINIC_OR_DEPARTMENT_OTHER): Payer: Self-pay | Admitting: *Deleted

## 2015-02-08 DIAGNOSIS — J029 Acute pharyngitis, unspecified: Secondary | ICD-10-CM | POA: Diagnosis present

## 2015-02-08 DIAGNOSIS — Z79899 Other long term (current) drug therapy: Secondary | ICD-10-CM | POA: Insufficient documentation

## 2015-02-08 DIAGNOSIS — Z9114 Patient's other noncompliance with medication regimen: Secondary | ICD-10-CM

## 2015-02-08 DIAGNOSIS — Z9119 Patient's noncompliance with other medical treatment and regimen: Secondary | ICD-10-CM | POA: Diagnosis not present

## 2015-02-08 DIAGNOSIS — F17210 Nicotine dependence, cigarettes, uncomplicated: Secondary | ICD-10-CM | POA: Diagnosis not present

## 2015-02-08 DIAGNOSIS — I10 Essential (primary) hypertension: Secondary | ICD-10-CM | POA: Insufficient documentation

## 2015-02-08 LAB — RAPID STREP SCREEN (MED CTR MEBANE ONLY): Streptococcus, Group A Screen (Direct): NEGATIVE

## 2015-02-08 MED ORDER — PENICILLIN G BENZATHINE 1200000 UNIT/2ML IM SUSP
1.2000 10*6.[IU] | Freq: Once | INTRAMUSCULAR | Status: AC
  Administered 2015-02-08: 1.2 10*6.[IU] via INTRAMUSCULAR
  Filled 2015-02-08: qty 2

## 2015-02-08 MED ORDER — LIDOCAINE VISCOUS 2 % MT SOLN
20.0000 mL | Freq: Once | OROMUCOSAL | Status: AC
  Administered 2015-02-08: 20 mL via OROMUCOSAL
  Filled 2015-02-08: qty 30

## 2015-02-08 MED ORDER — LIDOCAINE VISCOUS 2 % MT SOLN: 15.0000 mL | OROMUCOSAL | Status: DC | PRN

## 2015-02-08 MED ORDER — HYDROCHLOROTHIAZIDE 12.5 MG PO TABS: 12.5000 mg | ORAL_TABLET | Freq: Every day | ORAL | Status: DC

## 2015-02-08 NOTE — ED Provider Notes (Signed)
CSN: [id number]     Arrival date & time 02/08/15  1231 History   First MD Initiated Contact with Patient 02/08/15 1301     Chief Complaint  Patient presents with  . Sore Throat     (Consider location/radiation/quality/duration/timing/severity/associated sxs/prior Treatment) HPI  Blood pressure 155/93, pulse 96, temperature 99.9 F (37.7 C), temperature source Oral, resp. rate 20, height 5\' 6"  (1.676 m), weight 250 lb (113.399 kg), SpO2 99 %.  Patrick Levine is a 39 y.o. male complaining of severe sore throat radiating to right ear, tactile fever and myalgia onset 2 days ago. Patient has been taking over-the-counter medications with little relief. Patient denies drooling, cough, shortness of breath, rash. States he gets strep every several months. Has been noncompliant with his hypertension medications for "a while." He denies chest pain, shortness of breath, nausea, vomiting, abdominal pain, headache, change in vision, dysarthria, ataxia.    Past Medical History  Diagnosis Date  . Hypertension    Past Surgical History  Procedure Laterality Date  . Laceration repair  2001    lip laceration--stitches.   Family History  Problem Relation Age of Onset  . Hypertension Father    Social History  Substance Use Topics  . Smoking status: Current Some Day Smoker -- 0.50 packs/day    Types: Cigarettes  . Smokeless tobacco: Never Used  . Alcohol Use: 0.0 oz/week     Comment: 6-8 drinks/day    Review of Systems  10 systems reviewed and found to be negative, except as noted in the HPI.   Allergies  Bee venom  Home Medications   Prior to Admission medications   Medication Sig Start Date End Date Taking? Authorizing Provider  EPINEPHrine (EPIPEN 2-PAK) 0.3 mg/0.3 mL IJ SOAJ injection Inject 0.3 mLs (0.3 mg total) into the muscle once. 04/11/14  Yes Debbrah Alar, NP  fish oil-omega-3 fatty acids 1000 MG capsule Take 1 g by mouth daily.     Yes Historical Provider, MD   Multiple Vitamin (ONE-A-DAY MENS PO) Take 1 tablet by mouth.     Yes Historical Provider, MD  hydrochlorothiazide (HYDRODIURIL) 12.5 MG tablet Take 1 tablet (12.5 mg total) by mouth daily. 02/08/15   Raylyn Speckman, PA-C  lidocaine (XYLOCAINE) 2 % solution Use as directed 15 mLs in the mouth or throat every 3 (three) hours as needed. 02/08/15   Rosalea Withrow, PA-C   BP 152/91 mmHg  Pulse 97  Temp(Src) 99.9 F (37.7 C) (Oral)  Resp 22  Ht 5\' 6"  (1.676 m)  Wt 250 lb (113.399 kg)  BMI 40.37 kg/m2  SpO2 100% Physical Exam  Constitutional: He is oriented to person, place, and time. He appears well-developed and well-nourished. No distress.  HENT:  Head: Normocephalic.  Mouth/Throat: Oropharyngeal exudate present.  No drooling or stridor.   3+ tonsillar hypertrophy with exudate  Soft palate rises symmetrically. No TTP or induration under tongue.   No tenderness to palpation of frontal or bilateral maxillary sinuses.  No mucosal edema in the nares.  Bilateral tympanic membranes with normal architecture and good light reflex.    Eyes: Conjunctivae and EOM are normal. Pupils are equal, round, and reactive to light.  Neck: Normal range of motion. Neck supple.  Cardiovascular: Normal rate, regular rhythm and intact distal pulses.   Pulmonary/Chest: Effort normal and breath sounds normal. No stridor. No respiratory distress. He has no wheezes. He has no rales. He exhibits no tenderness.  Abdominal: Soft. Bowel sounds are normal. He exhibits no distension and  no mass. There is no tenderness. There is no rebound and no guarding.  Musculoskeletal: Normal range of motion.  Lymphadenopathy:    He has cervical adenopathy.  Neurological: He is alert and oriented to person, place, and time.  Psychiatric: He has a normal mood and affect.  Nursing note and vitals reviewed.   ED Course  Procedures (including critical care time) Labs Review Labs Reviewed  RAPID STREP SCREEN (NOT AT  [facility])  CULTURE, GROUP A STREP    Imaging Review No results found. I have personally reviewed and evaluated these images and lab results as part of my medical decision-making.   EKG Interpretation None      MDM   Final diagnoses:  Pharyngitis  Uncontrolled hypertension  History of medication noncompliance    Filed Vitals:   02/08/15 1237 02/08/15 1423  BP: 155/93 152/91  Pulse: 96 97  Temp: 99.9 F (37.7 C)   TempSrc: Oral   Resp: 20 22  Height: 5\' 6"  (1.676 m)   Weight: 250 lb (113.399 kg)   SpO2: 99% 100%    Medications  penicillin g benzathine (BICILLIN LA) 1200000 UNIT/2ML injection 1.2 Million Units (1.2 Million Units Intramuscular Given 02/08/15 1406)  lidocaine (XYLOCAINE) 2 % viscous mouth solution 20 mL (20 mLs Mouth/Throat Given 02/08/15 1407)    Patrick Levine is 39 y.o. male presenting with sore throat tactile fever and right ear pain. Physical exam is consistent with a strep pharyngitis. Patient given the option of amoxicillin or Bicillin and he chooses the shot. Patient states that he is noncompliant with his hypertension medications. We've had an extensive discussion on the importance of taking his high blood pressure medications daily as directed, we've also had an extensive discussion on smoking cessation. Encouraged patient to follow closely with his primary care physician.   Evaluation does not show pathology that would require ongoing emergent intervention or inpatient treatment. Pt is hemodynamically stable and mentating appropriately. Discussed findings and plan with patient/guardian, who agrees with care plan. All questions answered. Return precautions discussed and outpatient follow up given.   Discharge Medication List as of 02/08/2015  2:01 PM    START taking these medications   Details  lidocaine (XYLOCAINE) 2 % solution Use as directed 15 mLs in the mouth or throat every 3 (three) hours as needed., Starting 02/08/2015, Until  Discontinued, Print             Monico Blitz, PA-C 02/08/15 1639  Harvel Quale, MD 02/08/15 2140

## 2015-02-08 NOTE — ED Notes (Signed)
Sore throat since Friday and right ear pain

## 2015-02-08 NOTE — Discharge Instructions (Signed)
For pain control please take ibuprofen (also known as Motrin or Advil) 800mg  (this is normally 4 over the counter pills) 3 times a day  for 5 days. Take with food to minimize stomach irritation.  Please follow with your primary care doctor in the next 2 days for a check-up. They must obtain records for further management.   Do not hesitate to return to the Emergency Department for any new, worsening or concerning symptoms.    Hypertension Hypertension, commonly called high blood pressure, is when the force of blood pumping through your arteries is too strong. Your arteries are the blood vessels that carry blood from your heart throughout your body. A blood pressure reading consists of a higher number over a lower number, such as 110/72. The higher number (systolic) is the pressure inside your arteries when your heart pumps. The lower number (diastolic) is the pressure inside your arteries when your heart relaxes. Ideally you want your blood pressure below 120/80. Hypertension forces your heart to work harder to pump blood. Your arteries may become narrow or stiff. Having untreated or uncontrolled hypertension can cause heart attack, stroke, kidney disease, and other problems. RISK FACTORS Some risk factors for high blood pressure are controllable. Others are not.  Risk factors you cannot control include:   Race. You may be at higher risk if you are African American.  Age. Risk increases with age.  Gender. Men are at higher risk than women before age 3 years. After age 62, women are at higher risk than men. Risk factors you can control include:  Not getting enough exercise or physical activity.  Being overweight.  Getting too much fat, sugar, calories, or salt in your diet.  Drinking too much alcohol. SIGNS AND SYMPTOMS Hypertension does not usually cause signs or symptoms. Extremely high blood pressure (hypertensive crisis) may cause headache, anxiety, shortness of breath, and  nosebleed. DIAGNOSIS To check if you have hypertension, your health care provider will measure your blood pressure while you are seated, with your arm held at the level of your heart. It should be measured at least twice using the same arm. Certain conditions can cause a difference in blood pressure between your right and left arms. A blood pressure reading that is higher than normal on one occasion does not mean that you need treatment. If it is not clear whether you have high blood pressure, you may be asked to return on a different day to have your blood pressure checked again. Or, you may be asked to monitor your blood pressure at home for 1 or more weeks. TREATMENT Treating high blood pressure includes making lifestyle changes and possibly taking medicine. Living a healthy lifestyle can help lower high blood pressure. You may need to change some of your habits. Lifestyle changes may include:  Following the DASH diet. This diet is high in fruits, vegetables, and whole grains. It is low in salt, red meat, and added sugars.  Keep your sodium intake below 2,300 mg per day.  Getting at least 30-45 minutes of aerobic exercise at least 4 times per week.  Losing weight if necessary.  Not smoking.  Limiting alcoholic beverages.  Learning ways to reduce stress. Your health care provider may prescribe medicine if lifestyle changes are not enough to get your blood pressure under control, and if one of the following is true:  You are 40-38 years of age and your systolic blood pressure is above 140.  You are 19 years of age or older,  and your systolic blood pressure is above 150.  Your diastolic blood pressure is above 90.  You have diabetes, and your systolic blood pressure is over [id number] or your diastolic blood pressure is over 90.  You have kidney disease and your blood pressure is above 140/90.  You have heart disease and your blood pressure is above 140/90. Your personal target blood  pressure may vary depending on your medical conditions, your age, and other factors. HOME CARE INSTRUCTIONS  Have your blood pressure rechecked as directed by your health care provider.   Take medicines only as directed by your health care provider. Follow the directions carefully. Blood pressure medicines must be taken as prescribed. The medicine does not work as well when you skip doses. Skipping doses also puts you at risk for problems.  Do not smoke.   Monitor your blood pressure at home as directed by your health care provider. SEEK MEDICAL CARE IF:   You think you are having a reaction to medicines taken.  You have recurrent headaches or feel dizzy.  You have swelling in your ankles.  You have trouble with your vision. SEEK IMMEDIATE MEDICAL CARE IF:  You develop a severe headache or confusion.  You have unusual weakness, numbness, or feel faint.  You have severe chest or abdominal pain.  You vomit repeatedly.  You have trouble breathing. MAKE SURE YOU:   Understand these instructions.  Will watch your condition.  Will get help right away if you are not doing well or get worse.   This information is not intended to replace advice given to you by your health care provider. Make sure you discuss any questions you have with your health care provider.   Document Released: 03/14/2005 Document Revised: 07/29/2014 Document Reviewed: 01/04/2013 Elsevier Interactive Patient Education [employer].

## 2015-02-11 LAB — CULTURE, GROUP A STREP

## 2015-03-24 ENCOUNTER — Telehealth: Payer: Self-pay | Admitting: Family

## 2015-03-24 NOTE — Telephone Encounter (Signed)
Patient Name: Patrick Levine  DOB: 11-Jul-1975    Initial Comment Caller states c/o numbness, tingling and weakness in both hands   Nurse Assessment  Nurse: Orvan Seen, RN, Jacquilin Date/Time (Eastern Time): 03/24/2015 9:39:41 AM  Confirm and document reason for call. If symptomatic, describe symptoms. ---Caller states c/o numbness, tingling and weakness in both hands/finger tips- Started 4+ weeks ago  Has the patient traveled out of the country within the last 30 days? ---No  Does the patient have any new or worsening symptoms? ---Yes  Will a triage be completed? ---Yes  Related visit to physician within the last 2 weeks? ---No  Does the PT have any chronic conditions? (i.e. diabetes, asthma, etc.) ---No  Is this a behavioral health or substance abuse call? ---No     Guidelines    Guideline Title Affirmed Question Affirmed Notes  Neurologic Deficit [1] Weakness of arm / hand, or leg / foot AND [2] is a chronic symptom (recurrent or ongoing AND present > 4 weeks)    Final Disposition User   See PCP When Office is Open (within 3 days) Orvan Seen, RN, Jacquilin    Comments  Appointment made [phone] with Dr. Birdie Riddle on 12/28   Referrals  REFERRED TO PCP OFFICE   Disagree/Comply: Leta Baptist

## 2015-03-25 ENCOUNTER — Encounter: Payer: Self-pay | Admitting: Family Medicine

## 2015-03-25 ENCOUNTER — Ambulatory Visit (INDEPENDENT_AMBULATORY_CARE_PROVIDER_SITE_OTHER): Payer: Managed Care, Other (non HMO) | Admitting: Family Medicine

## 2015-03-25 ENCOUNTER — Encounter (INDEPENDENT_AMBULATORY_CARE_PROVIDER_SITE_OTHER): Payer: Self-pay

## 2015-03-25 VITALS — BP 152/90 | HR 93 | Temp 98.0°F | Resp 16 | Ht 66.0 in | Wt 247.0 lb

## 2015-03-25 DIAGNOSIS — R208 Other disturbances of skin sensation: Secondary | ICD-10-CM | POA: Diagnosis not present

## 2015-03-25 DIAGNOSIS — I10 Essential (primary) hypertension: Secondary | ICD-10-CM | POA: Diagnosis not present

## 2015-03-25 DIAGNOSIS — R200 Anesthesia of skin: Secondary | ICD-10-CM

## 2015-03-25 LAB — CBC WITH DIFFERENTIAL/PLATELET
BASOS PCT: 0.4 % (ref 0.0–3.0)
Basophils Absolute: 0 10*3/uL (ref 0.0–0.1)
EOS ABS: 0.1 10*3/uL (ref 0.0–0.7)
EOS PCT: 1.3 % (ref 0.0–5.0)
HCT: 42.3 % (ref 39.0–52.0)
Hemoglobin: 14.1 g/dL (ref 13.0–17.0)
LYMPHS ABS: 2.1 10*3/uL (ref 0.7–4.0)
Lymphocytes Relative: 20 % (ref 12.0–46.0)
MCHC: 33.3 g/dL (ref 30.0–36.0)
MCV: 88.5 fl (ref 78.0–100.0)
MONO ABS: 0.7 10*3/uL (ref 0.1–1.0)
Monocytes Relative: 6.4 % (ref 3.0–12.0)
NEUTROS PCT: 71.9 % (ref 43.0–77.0)
Neutro Abs: 7.4 10*3/uL (ref 1.4–7.7)
PLATELETS: 315 10*3/uL (ref 150.0–400.0)
RBC: 4.78 Mil/uL (ref 4.22–5.81)
RDW: 14 % (ref 11.5–15.5)
WBC: 10.3 10*3/uL (ref 4.0–10.5)

## 2015-03-25 LAB — HEMOGLOBIN A1C: HEMOGLOBIN A1C: 5.7 % (ref 4.6–6.5)

## 2015-03-25 LAB — TSH: TSH: 3.61 u[IU]/mL (ref 0.35–4.50)

## 2015-03-25 LAB — HEPATIC FUNCTION PANEL
ALK PHOS: 60 U/L (ref 39–117)
ALT: 14 U/L (ref 0–53)
AST: 16 U/L (ref 0–37)
Albumin: 4.2 g/dL (ref 3.5–5.2)
BILIRUBIN TOTAL: 0.2 mg/dL (ref 0.2–1.2)
Bilirubin, Direct: 0 mg/dL (ref 0.0–0.3)
Total Protein: 7.8 g/dL (ref 6.0–8.3)

## 2015-03-25 LAB — BASIC METABOLIC PANEL
BUN: 16 mg/dL (ref 6–23)
CHLORIDE: 105 meq/L (ref 96–112)
CO2: 22 mEq/L (ref 19–32)
Calcium: 9.9 mg/dL (ref 8.4–10.5)
Creatinine, Ser: 0.98 mg/dL (ref 0.40–1.50)
GFR: 108.97 mL/min (ref >60.00)
Glucose, Bld: 100 mg/dL — ABNORMAL HIGH (ref 70–99)
POTASSIUM: 4 meq/L (ref 3.5–5.1)
Sodium: 137 mEq/L (ref 135–145)

## 2015-03-25 LAB — B12 AND FOLATE PANEL
FOLATE: 4.3 ng/mL — AB (ref >5.9)
VITAMIN B 12: 252 pg/mL (ref 211–911)

## 2015-03-25 MED ORDER — MELOXICAM 15 MG PO TABS: 15.0000 mg | ORAL_TABLET | Freq: Every day | ORAL | Status: DC

## 2015-03-25 MED ORDER — LOSARTAN POTASSIUM 50 MG PO TABS: 50.0000 mg | ORAL_TABLET | Freq: Every day | ORAL | Status: DC

## 2015-03-25 NOTE — Assessment & Plan Note (Signed)
New.  Pt's sxs are consistent w/ carpal tunnel.  Start schedule NSAIDs.  OTC wrist splints for nightly use.  Check labs to r/o metabolic cause of numbness.  If no improvement, pt will need referral for nerve conduction studies.  Reviewed supportive care and red flags that should prompt return.  Pt expressed understanding and is in agreement w/ plan.

## 2015-03-25 NOTE — Assessment & Plan Note (Signed)
Deteriorated.  Pt's BP is not well controlled despite starting HCTZ (per ER) last month.  He is having unpleasant side effects from HCTZ (urinary pressure and frequency).  Based on side effects and that med is not effective in controlling BP, will switch to losartan.  Reviewed lifestyle modifications that will also improve BP.  Pt to f/u w/ PCP for additional management.  Pt expressed understanding and is in agreement w/ plan.

## 2015-03-25 NOTE — Progress Notes (Signed)
Pre visit review using our clinic review tool, if applicable. No additional management support is needed unless otherwise documented below in the visit note. 

## 2015-03-25 NOTE — Patient Instructions (Signed)
Follow up in 3-4 weeks to recheck BP and carpal tunnel We'll notify you of your lab results and make any changes if needed Try and make healthy food choices and get regular exercise STOP the HCTZ START the losartan daily for the blood pressure Start the Mobic once daily for the pain and inflammation Buy OTC wrist braces to wear at night to improve carpal tunnel Call with any questions or concerns Hang in there!!!

## 2015-03-25 NOTE — Progress Notes (Signed)
   Subjective:    Patient ID: Patrick Levine, male    DOB: 1975-12-22, 39 y.o.   MRN: [id number]  HPI HTN- chronic problem, was started on HCTZ last month after ER visit.  Pt reports some visual changes when looking extreme L/R.  Pt reports urinary 'pressure' since starting medication over a month ago.  Increased urination.  Hand numbness- bilateral, 1st 3 fingers.  sxs started 'a little more than a month' ago.  No weakness of grip.  sxs will wax and wane.  Works w/ Emergency planning/management officer regularly.  sxs are worse 1st thing in the AM.     Review of Systems For ROS see HPI     Objective:   Physical Exam  Constitutional: He is oriented to person, place, and time. He appears well-developed and well-nourished. No distress.  obese  HENT:  Head: Normocephalic and atraumatic.  Eyes: Conjunctivae and EOM are normal. Pupils are equal, round, and reactive to light.  Neck: Normal range of motion. Neck supple. No thyromegaly present.  Cardiovascular: Normal rate, regular rhythm, normal heart sounds and intact distal pulses.   No murmur heard. Pulmonary/Chest: Effort normal and breath sounds normal. No respiratory distress.  Abdominal: Soft. Bowel sounds are normal. He exhibits no distension.  Musculoskeletal: He exhibits no edema.  Lymphadenopathy:    He has no cervical adenopathy.  Neurological: He is alert and oriented to person, place, and time. He has normal reflexes. No cranial nerve deficit.  Faintly + phalen's bilaterally  Skin: Skin is warm and dry.  Psychiatric: He has a normal mood and affect. His behavior is normal.  Vitals reviewed.         Assessment & Plan:

## 2015-04-22 ENCOUNTER — Telehealth: Payer: Self-pay | Admitting: Family

## 2015-04-22 ENCOUNTER — Encounter: Payer: Self-pay | Admitting: Family

## 2015-04-22 ENCOUNTER — Ambulatory Visit (INDEPENDENT_AMBULATORY_CARE_PROVIDER_SITE_OTHER): Payer: Managed Care, Other (non HMO) | Admitting: Family

## 2015-04-22 VITALS — BP 160/108 | HR 75 | Temp 98.1°F | Resp 18 | Ht 68.0 in | Wt 253.0 lb

## 2015-04-22 DIAGNOSIS — R7611 Nonspecific reaction to tuberculin skin test without active tuberculosis: Secondary | ICD-10-CM

## 2015-04-22 DIAGNOSIS — I10 Essential (primary) hypertension: Secondary | ICD-10-CM

## 2015-04-22 DIAGNOSIS — R208 Other disturbances of skin sensation: Secondary | ICD-10-CM | POA: Diagnosis not present

## 2015-04-22 DIAGNOSIS — R200 Anesthesia of skin: Secondary | ICD-10-CM

## 2015-04-22 LAB — BASIC METABOLIC PANEL
BUN: 13 mg/dL (ref 6–23)
CALCIUM: 9.6 mg/dL (ref 8.4–10.5)
CO2: 26 mEq/L (ref 19–32)
CREATININE: 1.05 mg/dL (ref 0.40–1.50)
Chloride: 105 mEq/L (ref 96–112)
GFR: 100.59 mL/min (ref >60.00)
Glucose, Bld: 108 mg/dL — ABNORMAL HIGH (ref 70–99)
Potassium: 4 mEq/L (ref 3.5–5.1)
SODIUM: 138 meq/L (ref 135–145)

## 2015-04-22 MED ORDER — FOLIC ACID 400 MCG PO TABS: 400.0000 ug | ORAL_TABLET | Freq: Every day | ORAL | Status: DC

## 2015-04-22 MED ORDER — EPINEPHRINE 0.3 MG/0.3ML IJ SOAJ: 0.3000 mg | Freq: Once | INTRAMUSCULAR | Status: DC

## 2015-04-22 MED ORDER — AMLODIPINE BESYLATE 5 MG PO TABS: 5.0000 mg | ORAL_TABLET | Freq: Every day | ORAL | Status: DC

## 2015-04-22 MED FILL — AMLODIPINE BESYLATE 5 MG TA: 5 | 30 days supply | Qty: 30 | Fill #0

## 2015-04-22 NOTE — Telephone Encounter (Signed)
Could you please initiate referral to [facility] for treatment of + PPD? Thank you

## 2015-04-22 NOTE — Patient Instructions (Signed)
Please complete lab work prior to leaving. Add amlodipine 5mg  once daily as well as folic acid supplement (OTC). You will be contacted about your referral to the [facility] for treatment (Positive PPD). Follow up in 1 month.

## 2015-04-22 NOTE — Assessment & Plan Note (Signed)
Advised pt to follow through with referral to Health dept. 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

## 2015-04-22 NOTE — Progress Notes (Signed)
   Subjective:    Patient ID: Patrick Levine, male    DOB: 1975-06-08, 40 y.o.   MRN: [id number]  HPI  [name] is a 40 yr old male who presents today for follow up.  1) HTN- He saw Dr. Birdie Riddle on 03/25/15.  He had urinary frequency on hctz at that time and hctz was changed to losartan.  Patient reports that in the morning he had dizziness initially after beginning losartan.  However dizziness has resolved. Reports that this occurred 3 weeks ago.  BP Readings from Last 3 Encounters:  04/22/15 160/108  03/25/15 152/90  02/08/15 152/91   2) Numbness- last visit it was recommended that he start scheduled NSAIDS, otc wrist splints for nightly use. Folate level was low last visit. He has not yet started folic acid.    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 Though reports he was contacted.   Review of Systems  Respiratory: Negative for shortness of breath.   Cardiovascular: Negative for chest pain.       Objective:   Physical Exam  Constitutional: He is oriented to person, place, and time. He appears well-developed and well-nourished. No distress.  HENT:  Head: Normocephalic and atraumatic.  Cardiovascular: Normal rate and regular rhythm.   No murmur heard. Pulmonary/Chest: Effort normal and breath sounds normal. No respiratory distress. He has no wheezes. He has no rales.  Musculoskeletal: He exhibits no edema.  Neurological: He is alert and oriented to person, place, and time.  Skin: Skin is warm and dry.  Psychiatric: He has a normal mood and affect. His behavior is normal. Thought content normal.          Assessment & Plan:

## 2015-04-22 NOTE — Telephone Encounter (Signed)
Contacted the health department, was told to fax over records, records have been faxed

## 2015-04-22 NOTE — Progress Notes (Signed)
Pre visit review using our clinic review tool, if applicable. No additional management support is needed unless otherwise documented below in the visit note. 

## 2015-04-22 NOTE — Assessment & Plan Note (Signed)
Likely related to low folate + some carpal tunnel. Pt advised to continue meloxicam, add folate.

## 2015-04-22 NOTE — Assessment & Plan Note (Signed)
BP remains elevated. Continue losartan, obtain follow up bmet. Add amlodipine, follow up in 1 month for BP recheck.

## 2015-05-25 ENCOUNTER — Ambulatory Visit: Payer: Managed Care, Other (non HMO) | Admitting: Family

## 2015-05-26 ENCOUNTER — Telehealth: Payer: Self-pay | Admitting: Family

## 2015-05-26 NOTE — Telephone Encounter (Signed)
Pt was no show 05/25/15 9:30am for follow up appt, pt has not rescheduled, 3rd no show (2 cpe, 1 f/u), charge or no charge?

## 2015-05-26 NOTE — Telephone Encounter (Signed)
Yes please

## 2015-05-27 ENCOUNTER — Encounter: Payer: Self-pay | Admitting: Family

## 2015-05-27 NOTE — Telephone Encounter (Signed)
Marked to charge and mailing no show letter °

## 2015-06-05 ENCOUNTER — Encounter: Payer: Self-pay | Admitting: Family

## 2015-06-09 ENCOUNTER — Encounter: Payer: Self-pay | Admitting: Family

## 2015-06-17 ENCOUNTER — Telehealth: Payer: Self-pay | Admitting: Family

## 2015-06-17 NOTE — Telephone Encounter (Signed)
Patient dismissed from [facility] by Debbrah Alar , effective June 09, 2015. Dismissal letter sent out by certified / registered mail.  DAJ  Received signed domestic return receipt verifying delivery of certified letter on June 19, 2015. Article number [id number] [place] DAJ

## 2015-09-01 ENCOUNTER — Encounter (HOSPITAL_COMMUNITY): Payer: Self-pay | Admitting: *Deleted

## 2015-09-01 ENCOUNTER — Emergency Department (HOSPITAL_COMMUNITY)
Admission: EM | Admit: 2015-09-01 | Discharge: 2015-09-01 | Disposition: A | Discharge status: Home / Self Care | Payer: Managed Care, Other (non HMO) | Attending: Emergency Medicine | Admitting: Emergency Medicine

## 2015-09-01 ENCOUNTER — Emergency Department (HOSPITAL_COMMUNITY): Payer: Managed Care, Other (non HMO)

## 2015-09-01 DIAGNOSIS — R519 Headache, unspecified: Secondary | ICD-10-CM

## 2015-09-01 DIAGNOSIS — F17210 Nicotine dependence, cigarettes, uncomplicated: Secondary | ICD-10-CM | POA: Diagnosis not present

## 2015-09-01 DIAGNOSIS — Z791 Long term (current) use of non-steroidal anti-inflammatories (NSAID): Secondary | ICD-10-CM | POA: Diagnosis not present

## 2015-09-01 DIAGNOSIS — R51 Headache: Secondary | ICD-10-CM

## 2015-09-01 DIAGNOSIS — Z79899 Other long term (current) drug therapy: Secondary | ICD-10-CM | POA: Insufficient documentation

## 2015-09-01 DIAGNOSIS — I10 Essential (primary) hypertension: Secondary | ICD-10-CM | POA: Insufficient documentation

## 2015-09-01 LAB — CBC WITH DIFFERENTIAL/PLATELET
BASOS ABS: 0 10*3/uL (ref 0.0–0.1)
BASOS PCT: 0 %
Eosinophils Absolute: 0.1 10*3/uL (ref 0.0–0.7)
Eosinophils Relative: 1 %
HCT: 40.9 % (ref 39.0–52.0)
HEMOGLOBIN: 13.8 g/dL (ref 13.0–17.0)
Lymphocytes Relative: 35 %
Lymphs Abs: 1.8 10*3/uL (ref 0.7–4.0)
MCH: 28.9 pg (ref 26.0–34.0)
MCHC: 33.7 g/dL (ref 30.0–36.0)
MCV: 85.6 fL (ref 78.0–100.0)
MONO ABS: 0.4 10*3/uL (ref 0.1–1.0)
Monocytes Relative: 8 %
NEUTROS ABS: 3 10*3/uL (ref 1.7–7.7)
NEUTROS PCT: 56 %
Platelets: 279 10*3/uL (ref 150–400)
RBC: 4.78 MIL/uL (ref 4.22–5.81)
RDW: 13 % (ref 11.5–15.5)
WBC: 5.3 10*3/uL (ref 4.0–10.5)

## 2015-09-01 LAB — BASIC METABOLIC PANEL
ANION GAP: 10 (ref 5–15)
BUN: 9 mg/dL (ref 6–20)
CALCIUM: 9.5 mg/dL (ref 8.9–10.3)
CO2: 22 mmol/L (ref 22–32)
CREATININE: 0.88 mg/dL (ref 0.61–1.24)
Chloride: 102 mmol/L (ref 101–111)
GFR calc non Af Amer: >60 mL/min (ref >60)
Glucose, Bld: 105 mg/dL — ABNORMAL HIGH (ref 65–99)
Potassium: 3.7 mmol/L (ref 3.5–5.1)
Sodium: 134 mmol/L — ABNORMAL LOW (ref 135–145)

## 2015-09-01 MED ORDER — AMLODIPINE BESYLATE 5 MG PO TABS
5.0000 mg | ORAL_TABLET | Freq: Every day | ORAL | Status: DC
  Administered 2015-09-01: 5 mg via ORAL
  Filled 2015-09-01: qty 1

## 2015-09-01 MED ORDER — DIPHENHYDRAMINE HCL 50 MG/ML IJ SOLN
25.0000 mg | Freq: Once | INTRAMUSCULAR | Status: AC
  Administered 2015-09-01: 25 mg via INTRAVENOUS
  Filled 2015-09-01: qty 1

## 2015-09-01 MED ORDER — LOSARTAN POTASSIUM 50 MG PO TABS: 50.0000 mg | ORAL_TABLET | Freq: Every day | ORAL | Status: DC

## 2015-09-01 MED ORDER — AMLODIPINE BESYLATE 5 MG PO TABS: 5.0000 mg | ORAL_TABLET | Freq: Every day | ORAL | Status: DC

## 2015-09-01 MED ORDER — METOCLOPRAMIDE HCL 5 MG/ML IJ SOLN
10.0000 mg | Freq: Once | INTRAMUSCULAR | Status: AC
  Administered 2015-09-01: 10 mg via INTRAVENOUS
  Filled 2015-09-01: qty 2

## 2015-09-01 MED ORDER — KETOROLAC TROMETHAMINE 30 MG/ML IJ SOLN
30.0000 mg | Freq: Once | INTRAMUSCULAR | Status: AC
  Administered 2015-09-01: 30 mg via INTRAVENOUS
  Filled 2015-09-01: qty 1

## 2015-09-01 MED ORDER — LOSARTAN POTASSIUM 50 MG PO TABS
50.0000 mg | ORAL_TABLET | Freq: Every day | ORAL | Status: DC
  Administered 2015-09-01: 50 mg via ORAL
  Filled 2015-09-01: qty 1

## 2015-09-01 MED ORDER — IOPAMIDOL (ISOVUE-370) INJECTION 76%
INTRAVENOUS | Status: AC
  Administered 2015-09-01: 50 mL
  Filled 2015-09-01: qty 50

## 2015-09-01 NOTE — ED Notes (Signed)
Pt returned to room from CT

## 2015-09-01 NOTE — ED Provider Notes (Addendum)
CSN: [id number]     Arrival date & time 09/01/15  0424 History   First MD Initiated Contact with Patient 09/01/15 0700     Chief Complaint  Patient presents with  . Headache     (Consider location/radiation/quality/duration/timing/severity/associated sxs/prior Treatment) HPI Comments: Patient with history of hypertension, presents with headache intermittent the right side of his head for the past one week. States he had his blood pressure medications because his "dog at them". Headache is sharp and stabbing, comes and goes. It is intermittent with gradual onset. It woke him from sleep today around 4. He has any focal weakness, numbness or tingling. Denies any bowel or bladder incontinence. Denies any chest shortness of breath. Denies any blurry vision or double vision. Denies any fever. Been taking ibuprofen with some relief. there is no association with light or noise. When he went to bed last night there was no headache but when he woke up this morning around 4:00 so he cannot say how onset this morning.  Patient is a 40 y.o. male presenting with headaches. The history is provided by the patient.  Headache Associated symptoms: no abdominal pain, no back pain, no congestion, no cough, no dizziness, no fatigue, no myalgias, no nausea, no photophobia, no vomiting and no weakness     Past Medical History  Diagnosis Date  . Hypertension    Past Surgical History  Procedure Laterality Date  . Laceration repair  2001    lip laceration--stitches.   Family History  Problem Relation Age of Onset  . Hypertension Father    Social History  Substance Use Topics  . Smoking status: Current Some Day Smoker -- 0.50 packs/day    Types: Cigarettes  . Smokeless tobacco: Never Used  . Alcohol Use: 0.0 oz/week     Comment: 6-8 drinks/day    Review of Systems  Constitutional: Negative for activity change, appetite change and fatigue.  HENT: Negative for congestion.   Eyes: Negative for photophobia  and visual disturbance.  Respiratory: Negative for cough, chest tightness and shortness of breath.   Cardiovascular: Negative for chest pain.  Gastrointestinal: Negative for nausea, vomiting and abdominal pain.  Genitourinary: Negative for dysuria, urgency, hematuria and testicular pain.  Musculoskeletal: Negative for myalgias, back pain and arthralgias.  Skin: Negative for wound.  Neurological: Positive for headaches. Negative for dizziness, facial asymmetry and weakness.  A complete 10 system review of systems was obtained and all systems are negative except as noted in the HPI and PMH.      Allergies  Bee venom  Home Medications   Prior to Admission medications   Medication Sig Start Date End Date Taking? Authorizing Provider  amLODipine (NORVASC) 5 MG tablet Take 1 tablet (5 mg total) by mouth daily. 04/22/15   Debbrah Alar, NP  EPINEPHrine (EPIPEN 2-PAK) 0.3 mg/0.3 mL IJ SOAJ injection Inject 0.3 mLs (0.3 mg total) into the muscle once. 04/22/15   Debbrah Alar, NP  fish oil-omega-3 fatty acids 1000 MG capsule Take 1 g by mouth daily.      Historical Provider, MD  folic acid ([facility] FOLIC ACID) A999333 MCG tablet Take 1 tablet (400 mcg total) by mouth daily. 04/22/15   Debbrah Alar, NP  lidocaine (XYLOCAINE) 2 % solution Use as directed 15 mLs in the mouth or throat every 3 (three) hours as needed. 02/08/15   Nicole Pisciotta, PA-C  losartan (COZAAR) 50 MG tablet Take 1 tablet (50 mg total) by mouth daily. 03/25/15   Midge Minium, MD  meloxicam (MOBIC) 15 MG tablet Take 1 tablet (15 mg total) by mouth daily. 03/25/15   Midge Minium, MD  Multiple Vitamin (ONE-A-DAY MENS PO) Take 1 tablet by mouth.      Historical Provider, MD   BP 159/109 mmHg  Pulse 91  Temp(Src) 97.8 F (36.6 C) (Oral)  Resp 18  Ht 5\' 5"  (1.651 m)  Wt 250 lb (113.399 kg)  BMI 41.60 kg/m2  SpO2 97% Physical Exam  Constitutional: He is oriented to person, place, and time. He appears  well-developed and well-nourished. No distress.  HENT:  Head: Normocephalic and atraumatic.  Mouth/Throat: Oropharynx is clear and moist. No oropharyngeal exudate.  No temporal artery tenderness  Eyes: Conjunctivae and EOM are normal. Pupils are equal, round, and reactive to light.  Neck: Normal range of motion. Neck supple.  No meningismus.  Cardiovascular: Normal rate, regular rhythm, normal heart sounds and intact distal pulses.   No murmur heard. Pulmonary/Chest: Effort normal and breath sounds normal. No respiratory distress. He exhibits no tenderness.  Abdominal: Soft. There is no tenderness. There is no rebound and no guarding.  Musculoskeletal: Normal range of motion. He exhibits no edema or tenderness.  Neurological: He is alert and oriented to person, place, and time. No cranial nerve deficit. He exhibits normal muscle tone. Coordination normal.  No ataxia on finger to nose bilaterally. No pronator drift. 5/5 strength throughout. CN 2-12 intact.Equal grip strength. Sensation intact.  Negative romberg, normal gait  Skin: Skin is warm.  Psychiatric: He has a normal mood and affect. His behavior is normal.  Nursing note and vitals reviewed.   ED Course  Procedures (including critical care time) Labs Review Labs Reviewed  CBC WITH DIFFERENTIAL/PLATELET  BASIC METABOLIC PANEL    Imaging Review Ct Head Wo Contrast  09/01/2015  CLINICAL DATA:  Severe right-sided headache onset this morning. EXAM: CT HEAD WITHOUT CONTRAST TECHNIQUE: Contiguous axial images were obtained from the base of the skull through the vertex without intravenous contrast. COMPARISON:  12/20/2011 FINDINGS: Skull and Sinuses:Negative for fracture or destructive process. Chronic sinusitis with mucous retention cyst filling much of the left maxillary antrum and moderate right mucosal thickening. No fluid level is seen. Chronic ground-glass sclerosis in place of the right sphenoid sinus. Visualized orbits: Negative.  Brain: Unremarkable. No evidence of acute infarction, hemorrhage, hydrocephalus, or mass lesion/mass effect. IMPRESSION: 1. Negative intracranial imaging. 2. Chronic sinusitis that is similar to 2013 comparison. Electronically Signed   By: Monte Fantasia M.D.   On: 09/01/2015 05:22   I have personally reviewed and evaluated these images and lab results as part of my medical decision-making.   EKG Interpretation None      MDM   Final diagnoses:  Headache, unspecified headache type  Essential hypertension   History of hypertension, noncompliance with medications presenting with intermittent headache for the past 5 days. States headache woke him this morning at 4 AM. It has been intermittent and gradual in onset over the past several days.  CT head is negative. This is obtained within 2 hours of his headache. Headache has been intermittent coming and going for the past 4 or 5 days. This is not consistent with subarachnoid hemorrhage.  Neurological exam is nonfocal. CT angiogram shows no aneurysms or other pathology. Headache is improved with Toradol, Reglan and Benadryl. Blood pressure 142/92. We'll restart blood pressure medications.    Low suspicion for subarachnoid hemorrhage, meningitis, temporal arteritis. D/w Patient that lumbar puncture is indicated for sudden onset headache. He  cannot state how headache started as he woke up with it. It is coming and going and similar to previous headaches that he had.   Patient agrees this headache is similar to his previous headaches. He believes it started around 4 AM. He feels well and is not interested in lumbar puncture. He understands there is a small chance that some pathology could be missed.  Needs to establish care with PCP. Discussed compliance with blood pressure medication. Return precautions discussed. BP has improved.  BP 127/78 mmHg  Pulse 81  Temp(Src) 97.8 F (36.6 C) (Oral)  Resp 20  Ht 5\' 5"  (1.651 m)  Wt 250 lb (113.399  kg)  BMI 41.60 kg/m2  SpO2 100%  Ezequiel Essex, MD 09/01/15 1120  Ezequiel Essex, MD 09/01/15 1120

## 2015-09-01 NOTE — ED Notes (Signed)
Patient presents with severe headache on the right side of his head

## 2015-09-01 NOTE — ED Notes (Signed)
Pt transported to CT ?

## 2015-09-01 NOTE — Discharge Instructions (Signed)
Hypertension Your testing today is reassuring. Take your blood pressure medications as prescribed. You declined lumbar puncture today. Follow-up with your doctor. Return to the ED if you develop worsening headache, focal weakness, numbness or tingling, chest pain shortness of breath or concerns. Hypertension, commonly called high blood pressure, is when the force of blood pumping through your arteries is too strong. Your arteries are the blood vessels that carry blood from your heart throughout your body. A blood pressure reading consists of a higher number over a lower number, such as 110/72. The higher number (systolic) is the pressure inside your arteries when your heart pumps. The lower number (diastolic) is the pressure inside your arteries when your heart relaxes. Ideally you want your blood pressure below 120/80. Hypertension forces your heart to work harder to pump blood. Your arteries may become narrow or stiff. Having untreated or uncontrolled hypertension can cause heart attack, stroke, kidney disease, and other problems. RISK FACTORS Some risk factors for high blood pressure are controllable. Others are not.  Risk factors you cannot control include:   Race. You may be at higher risk if you are African American.  Age. Risk increases with age.  Gender. Men are at higher risk than women before age 29 years. After age 55, women are at higher risk than men. Risk factors you can control include:  Not getting enough exercise or physical activity.  Being overweight.  Getting too much fat, sugar, calories, or salt in your diet.  Drinking too much alcohol. SIGNS AND SYMPTOMS Hypertension does not usually cause signs or symptoms. Extremely high blood pressure (hypertensive crisis) may cause headache, anxiety, shortness of breath, and nosebleed. DIAGNOSIS To check if you have hypertension, your health care provider will measure your blood pressure while you are seated, with your arm held at  the level of your heart. It should be measured at least twice using the same arm. Certain conditions can cause a difference in blood pressure between your right and left arms. A blood pressure reading that is higher than normal on one occasion does not mean that you need treatment. If it is not clear whether you have high blood pressure, you may be asked to return on a different day to have your blood pressure checked again. Or, you may be asked to monitor your blood pressure at home for 1 or more weeks. TREATMENT Treating high blood pressure includes making lifestyle changes and possibly taking medicine. Living a healthy lifestyle can help lower high blood pressure. You may need to change some of your habits. Lifestyle changes may include:  Following the DASH diet. This diet is high in fruits, vegetables, and whole grains. It is low in salt, red meat, and added sugars.  Keep your sodium intake below 2,300 mg per day.  Getting at least 30-45 minutes of aerobic exercise at least 4 times per week.  Losing weight if necessary.  Not smoking.  Limiting alcoholic beverages.  Learning ways to reduce stress. Your health care provider may prescribe medicine if lifestyle changes are not enough to get your blood pressure under control, and if one of the following is true:  You are 82-58 years of age and your systolic blood pressure is above 140.  You are 18 years of age or older, and your systolic blood pressure is above 150.  Your diastolic blood pressure is above 90.  You have diabetes, and your systolic blood pressure is over [id number] or your diastolic blood pressure is over 90.  You  have kidney disease and your blood pressure is above 140/90.  You have heart disease and your blood pressure is above 140/90. Your personal target blood pressure may vary depending on your medical conditions, your age, and other factors. HOME CARE INSTRUCTIONS  Have your blood pressure rechecked as directed by your  health care provider.   Take medicines only as directed by your health care provider. Follow the directions carefully. Blood pressure medicines must be taken as prescribed. The medicine does not work as well when you skip doses. Skipping doses also puts you at risk for problems.  Do not smoke.   Monitor your blood pressure at home as directed by your health care provider. SEEK MEDICAL CARE IF:   You think you are having a reaction to medicines taken.  You have recurrent headaches or feel dizzy.  You have swelling in your ankles.  You have trouble with your vision. SEEK IMMEDIATE MEDICAL CARE IF:  You develop a severe headache or confusion.  You have unusual weakness, numbness, or feel faint.  You have severe chest or abdominal pain.  You vomit repeatedly.  You have trouble breathing. MAKE SURE YOU:   Understand these instructions.  Will watch your condition.  Will get help right away if you are not doing well or get worse.   This information is not intended to replace advice given to you by your health care provider. Make sure you discuss any questions you have with your health care provider.   Document Released: 03/14/2005 Document Revised: 07/29/2014 Document Reviewed: 01/04/2013 Elsevier Interactive Patient Education [employer].

## 2015-09-02 MED ORDER — IOPAMIDOL (ISOVUE-370) INJECTION 76%
50.0000 mL | Freq: Once | INTRAVENOUS | Status: AC | PRN
  Administered 2015-09-01: 50 mL via INTRAVENOUS

## 2015-10-29 DIAGNOSIS — G5603 Carpal tunnel syndrome, bilateral upper limbs: Secondary | ICD-10-CM | POA: Insufficient documentation

## 2016-05-03 ENCOUNTER — Ambulatory Visit (INDEPENDENT_AMBULATORY_CARE_PROVIDER_SITE_OTHER): Payer: Managed Care, Other (non HMO)

## 2016-05-03 ENCOUNTER — Ambulatory Visit (INDEPENDENT_AMBULATORY_CARE_PROVIDER_SITE_OTHER): Payer: Managed Care, Other (non HMO) | Admitting: Emergency Medicine

## 2016-05-03 VITALS — BP 136/88 | HR 76 | Temp 98.1°F | Resp 16 | Ht 65.0 in | Wt 247.2 lb

## 2016-05-03 DIAGNOSIS — R7611 Nonspecific reaction to tuberculin skin test without active tuberculosis: Secondary | ICD-10-CM

## 2016-05-03 DIAGNOSIS — Z111 Encounter for screening for respiratory tuberculosis: Secondary | ICD-10-CM

## 2016-05-03 NOTE — Patient Instructions (Signed)
     IF you received an x-ray today, you will receive an invoice from [facility]. Please contact [facility] at [phone] with questions or concerns regarding your invoice.   IF you received labwork today, you will receive an invoice from [facility]. Please contact [facility] at [phone] with questions or concerns regarding your invoice.   Our billing staff will not be able to assist you with questions regarding bills from these companies.  You will be contacted with the lab results as soon as they are available. The fastest way to get your results is to activate your My Chart account. Instructions are located on the last page of this paperwork. If you have not heard from us regarding the results in 2 weeks, please contact this office.     

## 2016-05-03 NOTE — Progress Notes (Signed)
Patrick Levine 41 y.o.   Chief Complaint  Patient presents with  . PPD Reading    Need TB test or chest exray     HISTORY OF PRESENT ILLNESS: This is a 41 y.o. male needs CXR. Has h/o positive PPD. Asymptomatic. No complaints.  HPI   Prior to Admission medications   Medication Sig Start Date End Date Taking? Authorizing Provider  amLODipine (NORVASC) 5 MG tablet Take 1 tablet (5 mg total) by mouth daily. 09/01/15  Yes Ezequiel Essex, MD  EPINEPHrine (EPIPEN 2-PAK) 0.3 mg/0.3 mL IJ SOAJ injection Inject 0.3 mLs (0.3 mg total) into the muscle once. 04/22/15  Yes Debbrah Alar, NP  fish oil-omega-3 fatty acids 1000 MG capsule Take 1 g by mouth daily.     Yes Historical Provider, MD  folic acid ([facility] FOLIC ACID) A999333 MCG tablet Take 1 tablet (400 mcg total) by mouth daily. 04/22/15  Yes Debbrah Alar, NP  lidocaine (XYLOCAINE) 2 % solution Use as directed 15 mLs in the mouth or throat every 3 (three) hours as needed. 02/08/15  Yes Nicole Pisciotta, PA-C  losartan (COZAAR) 50 MG tablet Take 1 tablet (50 mg total) by mouth daily. 09/01/15  Yes Ezequiel Essex, MD  meloxicam (MOBIC) 15 MG tablet Take 1 tablet (15 mg total) by mouth daily. 03/25/15  Yes Midge Minium, MD  Multiple Vitamin (ONE-A-DAY MENS PO) Take 1 tablet by mouth.     Yes Historical Provider, MD    Allergies  Allergen Reactions  . Bee Venom Anaphylaxis    Patient Active Problem List   Diagnosis Date Noted  . Numbness in both hands 03/25/2015  . 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 Urethral irritation 04/11/2014  . 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 Fracture of ribs, multiple, closed 12/26/2011  . HTN (hypertension) 12/26/2011    Past Medical History:  Diagnosis Date  . Hypertension     Past Surgical History:  Procedure Laterality Date  . LACERATION REPAIR  2001   lip laceration--stitches.    Social History   Social History  . Marital status: Married    Spouse name: N/A  .  Number of children: 4  . Years of education: N/A   Occupational History  . MENTAL HEALTH/CASE MANAGER [employer]   Social History Main Topics  . Smoking status: Current Some Day Smoker    Packs/day: 0.50    Types: Cigarettes  . Smokeless tobacco: Never Used  . Alcohol use 0.0 oz/week     Comment: 6-8 drinks/day  . Drug use: No     Comment: remote use of cocaine and marijuana  . Sexual activity: Yes   Other Topics Concern  . 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   Married          Family History  Problem Relation Age of Onset  . Hypertension Father      Review of Systems  Constitutional: Negative.   HENT: Negative.   Eyes: Negative.   Respiratory: Negative.   Cardiovascular: Negative.   Gastrointestinal: Negative.   Genitourinary: Negative.   Musculoskeletal: Negative.   Skin: Negative.   Neurological: Negative.   Endo/Heme/Allergies: Negative.   All other systems reviewed and are negative.  Vitals:   05/03/16 1129  BP: 136/88  Pulse: 76  Resp: 16  Temp:  98.1 F (36.7 C)     Physical Exam  Constitutional: He is oriented to person, place, and time. He appears well-developed and well-nourished.  HENT:  Head: Normocephalic and atraumatic.  Mouth/Throat: Oropharynx is clear and moist.  Eyes: Conjunctivae and EOM are normal. Pupils are equal, round, and reactive to light.  Neck: Normal range of motion. Neck supple.  Cardiovascular: Normal rate and regular rhythm.   Pulmonary/Chest: Effort normal and breath sounds normal.  Abdominal: Soft. Bowel sounds are normal. There is no tenderness.  Musculoskeletal: Normal range of motion.  Lymphadenopathy:    He has no cervical adenopathy.  Neurological: He is alert and oriented to person, place, and time. No sensory deficit. He exhibits normal muscle tone.   Skin: Skin is warm and dry. Capillary refill takes less than 2 seconds.  Vitals reviewed.    ASSESSMENT & PLAN: [name] was seen today for ppd reading.  Diagnoses and all orders for this visit:  History of positive PPD, untreated -     DG Chest 2 View; Future  Screening-pulmonary TB -     DG Chest 2 View; Future      Agustina Caroli, MD [facility]

## 2017-01-16 DIAGNOSIS — Z113 Encounter for screening for infections with a predominantly sexual mode of transmission: Secondary | ICD-10-CM | POA: Diagnosis not present

## 2017-02-28 ENCOUNTER — Ambulatory Visit (HOSPITAL_COMMUNITY): Payer: Self-pay

## 2017-05-19 ENCOUNTER — Ambulatory Visit (INDEPENDENT_AMBULATORY_CARE_PROVIDER_SITE_OTHER): Payer: Managed Care, Other (non HMO) | Admitting: Nurse Practitioner

## 2017-05-19 ENCOUNTER — Encounter: Payer: Self-pay | Admitting: Nurse Practitioner

## 2017-05-19 VITALS — BP 160/94 | HR 102 | Temp 98.3°F | Ht 65.0 in | Wt 255.0 lb

## 2017-05-19 DIAGNOSIS — J069 Acute upper respiratory infection, unspecified: Secondary | ICD-10-CM

## 2017-05-19 DIAGNOSIS — I10 Essential (primary) hypertension: Secondary | ICD-10-CM

## 2017-05-19 DIAGNOSIS — K219 Gastro-esophageal reflux disease without esophagitis: Secondary | ICD-10-CM

## 2017-05-19 DIAGNOSIS — J040 Acute laryngitis: Secondary | ICD-10-CM | POA: Diagnosis not present

## 2017-05-19 DIAGNOSIS — R739 Hyperglycemia, unspecified: Secondary | ICD-10-CM

## 2017-05-19 DIAGNOSIS — M25572 Pain in left ankle and joints of left foot: Secondary | ICD-10-CM

## 2017-05-19 DIAGNOSIS — G8929 Other chronic pain: Secondary | ICD-10-CM

## 2017-05-19 LAB — BASIC METABOLIC PANEL
BUN: 12 mg/dL (ref 6–23)
CHLORIDE: 104 meq/L (ref 96–112)
CO2: 25 meq/L (ref 19–32)
CREATININE: 1.05 mg/dL (ref 0.40–1.50)
Calcium: 9.6 mg/dL (ref 8.4–10.5)
GFR: 99.57 mL/min (ref >60.00)
Glucose, Bld: 110 mg/dL — ABNORMAL HIGH (ref 70–99)
POTASSIUM: 4.1 meq/L (ref 3.5–5.1)
Sodium: 138 mEq/L (ref 135–145)

## 2017-05-19 LAB — HEMOGLOBIN A1C: HEMOGLOBIN A1C: 5.9 % (ref 4.6–6.5)

## 2017-05-19 MED ORDER — OMEPRAZOLE 20 MG PO CPDR: 20.0000 mg | DELAYED_RELEASE_CAPSULE | Freq: Every day | ORAL | 3 refills | Status: DC

## 2017-05-19 MED ORDER — SALINE SPRAY 0.65 % NA SOLN: 1.0000 | NASAL | 0 refills | Status: DC | PRN

## 2017-05-19 MED ORDER — FLUTICASONE PROPIONATE 50 MCG/ACT NA SUSP: 2.0000 | Freq: Every day | NASAL | 0 refills | Status: DC

## 2017-05-19 NOTE — Assessment & Plan Note (Signed)
Uncontrolled. Noncompliant with medication. Resume amlodipine Obtain BMP today. F/up in 2weeks

## 2017-05-19 NOTE — Progress Notes (Signed)
Subjective:  Patient ID: Patrick Levine, male    DOB: July 26, 1975  Age: 42 y.o. MRN: [id number]  CC: Establish Care (est care/ voice scratchy 4 mo/ congestion,bodyache,yellow mucus--1 day/) and Ankle Pain (left ankle pain--going on for a while?????)  Ankle Pain   The incident occurred more than 1 week ago. The incident occurred at home. The injury mechanism was a twisting injury. The pain is present in the left ankle. The quality of the pain is described as aching. The pain has been intermittent since onset. Pertinent negatives include no inability to bear weight, loss of motion, loss of sensation, muscle weakness, numbness or tingling. He reports no foreign bodies present. The symptoms are aggravated by palpation. He has tried nothing for the symptoms.  Sore Throat   This is a recurrent problem. The current episode started more than 1 month ago. The problem has been waxing and waning. There has been no fever. Associated symptoms include congestion, coughing and a hoarse voice. Pertinent negatives include no abdominal pain, diarrhea, drooling, ear discharge, ear pain, headaches, plugged ear sensation, neck pain, shortness of breath, stridor, swollen glands, trouble swallowing or vomiting. He has had no exposure to strep or mono. He has tried nothing for the symptoms.  Hypertension  This is a chronic problem. The current episode started more than 1 year ago. The problem has been waxing and waning since onset. The problem is uncontrolled. Pertinent negatives include no anxiety, blurred vision, chest pain, headaches, malaise/fatigue, neck pain, orthopnea, palpitations, peripheral edema, PND, shortness of breath or sweats. There are no associated agents to hypertension. Risk factors for coronary artery disease include family history, male gender, smoking/tobacco exposure and sedentary lifestyle. Past treatments include ACE inhibitors, calcium channel blockers and diuretics. The current treatment provides  significant improvement. Compliance problems include medication side effects and psychosocial issues.    HTN: Not taking any medication at this time. No PCP in last 2years.   Hoarsenessx 4-77months Tobacco use daily 1ppdx 71yrs. GERD, pepcid use prn.  Outpatient Medications Prior to Visit  Medication Sig Dispense Refill  . amLODipine (NORVASC) 10 MG tablet amlodipine 10 mg tablet  Take 1 tablet every day by oral route.    Marland Kitchen EPINEPHrine (EPIPEN 2-PAK) 0.3 mg/0.3 mL IJ SOAJ injection Inject 0.3 mLs (0.3 mg total) into the muscle once. 2 Device 1  . Multiple Vitamin (ONE-A-DAY MENS PO) Take 1 tablet by mouth.      Marland Kitchen amLODipine (NORVASC) 5 MG tablet Take 1 tablet (5 mg total) by mouth daily. (Patient not taking: Reported on 05/19/2017) 30 tablet 0  . fish oil-omega-3 fatty acids 1000 MG capsule Take 1 g by mouth daily.      . folic acid ([employer] FOLIC ACID) 323 MCG tablet Take 1 tablet (400 mcg total) by mouth daily. (Patient not taking: Reported on 05/19/2017)    . lidocaine (XYLOCAINE) 2 % solution Use as directed 15 mLs in the mouth or throat every 3 (three) hours as needed. (Patient not taking: Reported on 05/19/2017) 100 mL 0  . losartan (COZAAR) 50 MG tablet Take 1 tablet (50 mg total) by mouth daily. (Patient not taking: Reported on 05/19/2017) 30 tablet 0  . meloxicam (MOBIC) 15 MG tablet Take 1 tablet (15 mg total) by mouth daily. (Patient not taking: Reported on 05/19/2017) 30 tablet 0   No facility-administered medications prior to visit.     ROS See HPI  Objective:  BP (!) 160/94   Pulse (!) 102   Temp 98.3  F (36.8 C)   Ht 5\' 5"  (1.651 m)   Wt 255 lb (115.7 kg)   SpO2 97%   BMI 42.43 kg/m   BP Readings from Last 3 Encounters:  05/19/17 (!) 160/94  05/03/16 136/88  09/01/15 127/78    Wt Readings from Last 3 Encounters:  05/19/17 255 lb (115.7 kg)  05/03/16 247 lb 3.2 oz (112.1 kg)  09/01/15 250 lb (113.4 kg)    Physical Exam  Constitutional: He is oriented to  person, place, and time. No distress.  HENT:  Right Ear: Tympanic membrane, external ear and ear canal normal.  Left Ear: Tympanic membrane and ear canal normal.  Nose: Mucosal edema and rhinorrhea present. Right sinus exhibits no maxillary sinus tenderness and no frontal sinus tenderness. Left sinus exhibits no maxillary sinus tenderness and no frontal sinus tenderness.  Mouth/Throat: Uvula is midline. No trismus in the jaw. Posterior oropharyngeal erythema present. No oropharyngeal exudate.    Eyes: No scleral icterus.  Neck: Normal range of motion. Neck supple. No thyromegaly present.  Cardiovascular: Normal rate and regular rhythm.  Pulmonary/Chest: Effort normal and breath sounds normal. No stridor.  Musculoskeletal:       Left ankle: He exhibits normal range of motion and no swelling. Tenderness. AITFL tenderness found. No lateral malleolus, no medial malleolus, no CF ligament, no posterior TFL, no head of 5th metatarsal and no proximal fibula tenderness found. Achilles tendon normal.  Lymphadenopathy:    He has no cervical adenopathy.  Neurological: He is alert and oriented to person, place, and time.  Skin: Skin is warm and dry. No erythema.  Psychiatric: He has a normal mood and affect. His behavior is normal.  Vitals reviewed.   Lab Results  Component Value Date   WBC 5.3 09/01/2015   HGB 13.8 09/01/2015   HCT 40.9 09/01/2015   PLT 279 09/01/2015   GLUCOSE 110 (H) 05/19/2017   ALT 14 03/25/2015   AST 16 03/25/2015   NA 138 05/19/2017   K 4.1 05/19/2017   CL 104 05/19/2017   CREATININE 1.05 05/19/2017   BUN 12 05/19/2017   CO2 25 05/19/2017   TSH 3.61 03/25/2015   HGBA1C 5.9 05/19/2017    Ct Angio Head W/cm &/or Wo Cm  Result Date: 09/01/2015 CLINICAL DATA:  Severe RIGHT-sided headache beginning earlier today. EXAM: CT ANGIOGRAPHY HEAD AND NECK TECHNIQUE: Multidetector CT imaging of the head and neck was performed using the standard protocol during bolus  administration of intravenous contrast. Multiplanar CT image reconstructions and MIPs were obtained to evaluate the vascular anatomy. Carotid stenosis measurements (when applicable) are obtained utilizing NASCET criteria, using the distal internal carotid diameter as the denominator. CONTRAST:  Isovue 370, 50 mL. COMPARISON:  CT head earlier today. FINDINGS: Slight venous contamination due to bolus timing difficulty. Overall study diagnostic CT HEAD Calvarium and skull base: No fracture or destructive lesion. Mastoids and middle ears are grossly clear. Paranasal sinuses: Changes of chronic sinusitis similar to 2013, with BILATERAL maxillary sinus retention cysts. Chronic sclerotic change in the RIGHT sphenoid, under aerated. No fluid level. Orbits: Negative. Brain: No evidence of acute abnormality, including acute infarct, hemorrhage, hydrocephalus, or mass lesion. CTA NECK Aortic arch: Standard branching. Imaged portion shows no evidence of aneurysm or dissection. No significant stenosis of the major arch vessel origins. Right carotid system: No evidence of dissection, stenosis (50% or greater) or occlusion. Left carotid system: No evidence of dissection, stenosis (50% or greater) or occlusion. Vertebral arteries: LEFT vertebral dominant. RIGHT  vertebral patent. No evidence of dissection, stenosis (50% or greater) or occlusion. Nonvascular soft tissues: Slight reversal of the normal cervical lordotic curve and cervical spine but no significant disc space narrowing. No neck masses. Lung apices clear. Trachea midline. CTA HEAD Anterior circulation: No significant stenosis, proximal occlusion, aneurysm, or vascular malformation. Posterior circulation: RIGHT vertebral is smaller but Levine contribute to basilar formation. No significant stenosis, proximal occlusion, aneurysm, or vascular malformation. Venous sinuses: As permitted by contrast timing, patent. Anatomic variants: None of significance. Delayed phase:   No  abnormal intracranial enhancement. IMPRESSION: No extracranial or intracranial flow reducing lesion or dissection. No acute intracranial findings or postcontrast enhancement. Electronically Signed   By: Staci Righter M.D.   On: 09/01/2015 09:31   Ct Head Wo Contrast  Result Date: 09/01/2015 CLINICAL DATA:  Severe right-sided headache onset this morning. EXAM: CT HEAD WITHOUT CONTRAST TECHNIQUE: Contiguous axial images were obtained from the base of the skull through the vertex without intravenous contrast. COMPARISON:  12/20/2011 FINDINGS: Skull and Sinuses:Negative for fracture or destructive process. Chronic sinusitis with mucous retention cyst filling much of the left maxillary antrum and moderate right mucosal thickening. No fluid level is seen. Chronic ground-glass sclerosis in place of the right sphenoid sinus. Visualized orbits: Negative. Brain: Unremarkable. No evidence of acute infarction, hemorrhage, hydrocephalus, or mass lesion/mass effect. IMPRESSION: 1. Negative intracranial imaging. 2. Chronic sinusitis that is similar to 2013 comparison. Electronically Signed   By: Monte Fantasia M.D.   On: 09/01/2015 05:22   Ct Angio Neck W/cm &/or Wo/cm  Result Date: 09/01/2015 CLINICAL DATA:  Severe RIGHT-sided headache beginning earlier today. EXAM: CT ANGIOGRAPHY HEAD AND NECK TECHNIQUE: Multidetector CT imaging of the head and neck was performed using the standard protocol during bolus administration of intravenous contrast. Multiplanar CT image reconstructions and MIPs were obtained to evaluate the vascular anatomy. Carotid stenosis measurements (when applicable) are obtained utilizing NASCET criteria, using the distal internal carotid diameter as the denominator. CONTRAST:  Isovue 370, 50 mL. COMPARISON:  CT head earlier today. FINDINGS: Slight venous contamination due to bolus timing difficulty. Overall study diagnostic CT HEAD Calvarium and skull base: No fracture or destructive lesion. Mastoids and  middle ears are grossly clear. Paranasal sinuses: Changes of chronic sinusitis similar to 2013, with BILATERAL maxillary sinus retention cysts. Chronic sclerotic change in the RIGHT sphenoid, under aerated. No fluid level. Orbits: Negative. Brain: No evidence of acute abnormality, including acute infarct, hemorrhage, hydrocephalus, or mass lesion. CTA NECK Aortic arch: Standard branching. Imaged portion shows no evidence of aneurysm or dissection. No significant stenosis of the major arch vessel origins. Right carotid system: No evidence of dissection, stenosis (50% or greater) or occlusion. Left carotid system: No evidence of dissection, stenosis (50% or greater) or occlusion. Vertebral arteries: LEFT vertebral dominant. RIGHT vertebral patent. No evidence of dissection, stenosis (50% or greater) or occlusion. Nonvascular soft tissues: Slight reversal of the normal cervical lordotic curve and cervical spine but no significant disc space narrowing. No neck masses. Lung apices clear. Trachea midline. CTA HEAD Anterior circulation: No significant stenosis, proximal occlusion, aneurysm, or vascular malformation. Posterior circulation: RIGHT vertebral is smaller but Levine contribute to basilar formation. No significant stenosis, proximal occlusion, aneurysm, or vascular malformation. Venous sinuses: As permitted by contrast timing, patent. Anatomic variants: None of significance. Delayed phase:   No abnormal intracranial enhancement. IMPRESSION: No extracranial or intracranial flow reducing lesion or dissection. No acute intracranial findings or postcontrast enhancement. Electronically Signed   By: Staci Righter  M.D.   On: 09/01/2015 09:31    Assessment & Plan:   Adden was seen today for establish care and ankle pain.  Diagnoses and all orders for this visit:  Essential hypertension -     Basic metabolic panel  Laryngitis -     omeprazole (PRILOSEC) 20 MG capsule; Take 1 capsule (20 mg total) by mouth  daily. -     fluticasone (FLONASE) 50 MCG/ACT nasal spray; Place 2 sprays into Levine nostrils daily.  Gastroesophageal reflux disease, esophagitis presence not specified -     omeprazole (PRILOSEC) 20 MG capsule; Take 1 capsule (20 mg total) by mouth daily.  Hyperglycemia -     Hemoglobin A1c  Acute URI -     fluticasone (FLONASE) 50 MCG/ACT nasal spray; Place 2 sprays into Levine nostrils daily. -     sodium chloride (OCEAN) 0.65 % SOLN nasal spray; Place 1 spray into Levine nostrils as needed for congestion.  Chronic pain of left ankle -     Ambulatory referral to Sports Medicine   I am having Aaron Edelman [place] start on omeprazole, fluticasone, and sodium chloride. I am also having him maintain his Multiple Vitamin (ONE-A-DAY MENS PO), fish oil-omega-3 fatty acids, lidocaine, meloxicam, EPINEPHrine, folic acid, amLODipine, losartan, and amLODipine.  Meds ordered this encounter  Medications  . omeprazole (PRILOSEC) 20 MG capsule    Sig: Take 1 capsule (20 mg total) by mouth daily.    Dispense:  30 capsule    Refill:  3    Order Specific Question:   Supervising Provider    Answer:   Lucille Passy [[id number]]  . fluticasone (FLONASE) 50 MCG/ACT nasal spray    Sig: Place 2 sprays into Levine nostrils daily.    Dispense:  16 g    Refill:  0    Order Specific Question:   Supervising Provider    Answer:   Lucille Passy [[id number]]  . sodium chloride (OCEAN) 0.65 % SOLN nasal spray    Sig: Place 1 spray into Levine nostrils as needed for congestion.    Dispense:  15 mL    Refill:  0    Order Specific Question:   Supervising Provider    Answer:   Lucille Passy [[id number]]    Follow-up: Return in about 2 weeks (around 06/02/2017).  [name], NP

## 2017-05-19 NOTE — Patient Instructions (Addendum)
Resume Amlodipine 10mg  as previously prescribed.  Strongly encourage to quit tobacco and alcohol use.  Stable renal function. Hgba1c indicates prediabetes. Need to maintain DASH diet and regular exercise. F/up as discussed  You will be called to schedule appt with sports medicine.  Consider referral to ENT if no improvement in hoarseness.  DASH Eating Plan DASH stands for "Dietary Approaches to Stop Hypertension." The DASH eating plan is a healthy eating plan that has been shown to reduce high blood pressure (hypertension). It may also reduce your risk for type 2 diabetes, heart disease, and stroke. The DASH eating plan may also help with weight loss. What are tips for following this plan? General guidelines  Avoid eating more than 2,300 mg (milligrams) of salt (sodium) a day. If you have hypertension, you may need to reduce your sodium intake to 1,500 mg a day.  Limit alcohol intake to no more than 1 drink a day for nonpregnant women and 2 drinks a day for men. One drink equals 12 oz of beer, 5 oz of wine, or 1 oz of hard liquor.  Work with your health care provider to maintain a healthy body weight or to lose weight. Ask what an ideal weight is for you.  Get at least 30 minutes of exercise that causes your heart to beat faster (aerobic exercise) most days of the week. Activities may include walking, swimming, or biking.  Work with your health care provider or diet and nutrition specialist (dietitian) to adjust your eating plan to your individual calorie needs. Reading food labels  Check food labels for the amount of sodium per serving. Choose foods with less than 5 percent of the Daily Value of sodium. Generally, foods with less than 300 mg of sodium per serving fit into this eating plan.  To find whole grains, look for the word "whole" as the first word in the ingredient list. Shopping  Buy products labeled as "low-sodium" or "no salt added."  Buy fresh foods. Avoid canned  foods and premade or frozen meals. Cooking  Avoid adding salt when cooking. Use salt-free seasonings or herbs instead of table salt or sea salt. Check with your health care provider or pharmacist before using salt substitutes.  Do not fry foods. Cook foods using healthy methods such as baking, boiling, grilling, and broiling instead.  Cook with heart-healthy oils, such as olive, canola, soybean, or sunflower oil. Meal planning   Eat a balanced diet that includes: ? 5 or more servings of fruits and vegetables each day. At each meal, try to fill half of your plate with fruits and vegetables. ? Up to 6-8 servings of whole grains each day. ? Less than 6 oz of lean meat, poultry, or fish each day. A 3-oz serving of meat is about the same size as a deck of cards. One egg equals 1 oz. ? 2 servings of low-fat dairy each day. ? A serving of nuts, seeds, or beans 5 times each week. ? Heart-healthy fats. Healthy fats called Omega-3 fatty acids are found in foods such as flaxseeds and coldwater fish, like sardines, salmon, and mackerel.  Limit how much you eat of the following: ? Canned or prepackaged foods. ? Food that is high in trans fat, such as fried foods. ? Food that is high in saturated fat, such as fatty meat. ? Sweets, desserts, sugary drinks, and other foods with added sugar. ? Full-fat dairy products.  Do not salt foods before eating.  Try to eat at least 2  vegetarian meals each week.  Eat more home-cooked food and less restaurant, buffet, and fast food.  When eating at a restaurant, ask that your food be prepared with less salt or no salt, if possible. What foods are recommended? The items listed may not be a complete list. Talk with your dietitian about what dietary choices are best for you. Grains Whole-grain or whole-wheat bread. Whole-grain or whole-wheat pasta. Brown rice. [place]. Bulgur. Whole-grain and low-sodium cereals. Pita bread. Low-fat, low-sodium crackers.  Whole-wheat flour tortillas. Vegetables Fresh or frozen vegetables (raw, steamed, roasted, or grilled). Low-sodium or reduced-sodium tomato and vegetable juice. Low-sodium or reduced-sodium tomato sauce and tomato paste. Low-sodium or reduced-sodium canned vegetables. Fruits All fresh, dried, or frozen fruit. Canned fruit in natural juice (without added sugar). Meat and other protein foods Skinless chicken or [place]. Ground chicken or [place]. Pork with fat trimmed off. Fish and seafood. Egg whites. Dried beans, peas, or lentils. Unsalted nuts, nut butters, and seeds. Unsalted canned beans. Lean cuts of beef with fat trimmed off. Low-sodium, lean deli meat. Dairy Low-fat (1%) or fat-free (skim) milk. Fat-free, low-fat, or reduced-fat cheeses. Nonfat, low-sodium ricotta or cottage cheese. Low-fat or nonfat yogurt. Low-fat, low-sodium cheese. Fats and oils Soft margarine without trans fats. Vegetable oil. Low-fat, reduced-fat, or light mayonnaise and salad dressings (reduced-sodium). Canola, safflower, olive, soybean, and sunflower oils. Avocado. Seasoning and other foods Herbs. Spices. Seasoning mixes without salt. Unsalted popcorn and pretzels. Fat-free sweets. What foods are not recommended? The items listed may not be a complete list. Talk with your dietitian about what dietary choices are best for you. Grains Baked goods made with fat, such as croissants, muffins, or some breads. Dry pasta or rice meal packs. Vegetables Creamed or fried vegetables. Vegetables in a cheese sauce. Regular canned vegetables (not low-sodium or reduced-sodium). Regular canned tomato sauce and paste (not low-sodium or reduced-sodium). Regular tomato and vegetable juice (not low-sodium or reduced-sodium). Angie Fava. Olives. Fruits Canned fruit in a light or heavy syrup. Fried fruit. Fruit in cream or butter sauce. Meat and other protein foods Fatty cuts of meat. Ribs. Fried meat. Berniece Salines. Sausage. Bologna and other  processed lunch meats. Salami. Fatback. Hotdogs. Bratwurst. Salted nuts and seeds. Canned beans with added salt. Canned or smoked fish. Whole eggs or egg yolks. Chicken or [place] with skin. Dairy Whole or 2% milk, cream, and half-and-half. Whole or full-fat cream cheese. Whole-fat or sweetened yogurt. Full-fat cheese. Nondairy creamers. Whipped toppings. Processed cheese and cheese spreads. Fats and oils Butter. Stick margarine. Lard. Shortening. Ghee. Bacon fat. Tropical oils, such as coconut, palm kernel, or palm oil. Seasoning and other foods Salted popcorn and pretzels. Onion salt, garlic salt, seasoned salt, table salt, and sea salt. Worcestershire sauce. Tartar sauce. Barbecue sauce. Teriyaki sauce. Soy sauce, including reduced-sodium. Steak sauce. Canned and packaged gravies. Fish sauce. Oyster sauce. Cocktail sauce. Horseradish that you find on the shelf. Ketchup. Mustard. Meat flavorings and tenderizers. Bouillon cubes. Hot sauce and Tabasco sauce. Premade or packaged marinades. Premade or packaged taco seasonings. Relishes. Regular salad dressings. Where to find more information:  National Heart, Lung, and [place]: https://wilson-eaton.com/  American Heart Association: www.heart.org Summary  The DASH eating plan is a healthy eating plan that has been shown to reduce high blood pressure (hypertension). It may also reduce your risk for type 2 diabetes, heart disease, and stroke.  With the DASH eating plan, you should limit salt (sodium) intake to 2,300 mg a day. If you have hypertension, you may need  to reduce your sodium intake to 1,500 mg a day.  When on the DASH eating plan, aim to eat more fresh fruits and vegetables, whole grains, lean proteins, low-fat dairy, and heart-healthy fats.  Work with your health care provider or diet and nutrition specialist (dietitian) to adjust your eating plan to your individual calorie needs. This information is not intended to replace advice given to  you by your health care provider. Make sure you discuss any questions you have with your health care provider. Document Released: 03/03/2011 Document Revised: 03/07/2016 Document Reviewed: 03/07/2016 Elsevier Interactive Patient Education  [employer].

## 2017-05-19 NOTE — Assessment & Plan Note (Signed)
Start PPI and flonase. Advise to quit ETOh and tobacco use. Consider referral to ENT if no improvement in 2weeks.

## 2017-05-22 ENCOUNTER — Encounter: Payer: Self-pay | Admitting: Nurse Practitioner

## 2017-06-02 ENCOUNTER — Ambulatory Visit: Payer: Managed Care, Other (non HMO) | Admitting: Family Medicine

## 2017-06-05 ENCOUNTER — Ambulatory Visit: Payer: Managed Care, Other (non HMO) | Admitting: Family Medicine

## 2017-06-05 ENCOUNTER — Ambulatory Visit: Payer: Managed Care, Other (non HMO) | Admitting: Nurse Practitioner

## 2017-06-06 ENCOUNTER — Encounter: Payer: Self-pay | Admitting: Nurse Practitioner

## 2017-06-06 ENCOUNTER — Ambulatory Visit (INDEPENDENT_AMBULATORY_CARE_PROVIDER_SITE_OTHER): Payer: Managed Care, Other (non HMO) | Admitting: Nurse Practitioner

## 2017-06-06 ENCOUNTER — Ambulatory Visit: Payer: Managed Care, Other (non HMO) | Admitting: Nurse Practitioner

## 2017-06-06 VITALS — BP 170/98 | HR 79 | Temp 98.0°F | Wt 257.0 lb

## 2017-06-06 DIAGNOSIS — J040 Acute laryngitis: Secondary | ICD-10-CM | POA: Diagnosis not present

## 2017-06-06 DIAGNOSIS — Z91030 Bee allergy status: Secondary | ICD-10-CM

## 2017-06-06 DIAGNOSIS — I10 Essential (primary) hypertension: Secondary | ICD-10-CM | POA: Diagnosis not present

## 2017-06-06 MED ORDER — CHLORTHALIDONE 25 MG PO TABS: 25.0000 mg | ORAL_TABLET | Freq: Every day | ORAL | 0 refills | Status: DC

## 2017-06-06 MED ORDER — AMLODIPINE BESYLATE 5 MG PO TABS: 5.0000 mg | ORAL_TABLET | Freq: Every day | ORAL | 0 refills | Status: DC

## 2017-06-06 MED ORDER — EPINEPHRINE 0.3 MG/0.3ML IJ SOAJ: 0.3000 mg | Freq: Once | INTRAMUSCULAR | 0 refills | Status: AC

## 2017-06-06 NOTE — Progress Notes (Signed)
Subjective:  Patient ID: Patrick Levine, male    DOB: [birth year]  Age: 42 y.o. MRN: [id number]  CC: Follow-up (F/U for HTN. Thyroid lab? epipen refills? BP med refills?)   HPI  HTN: Reports he has not been taking medication as prescribed. Reports he has been mistakenly taking omeprazole thinking is was amlodipine. BP Readings from Last 3 Encounters:  06/06/17 (!) 170/98  05/19/17 (!) 160/94  05/03/16 136/88   Reports persistent hoarseness despite use of flonase and omeprazole.  Outpatient Medications Prior to Visit  Medication Sig Dispense Refill  . fluticasone (FLONASE) 50 MCG/ACT nasal spray Place 2 sprays into both nostrils daily. 16 g 0  . Multiple Vitamin (ONE-A-DAY MENS PO) Take 1 tablet by mouth.      Marland Kitchen omeprazole (PRILOSEC) 20 MG capsule Take 1 capsule (20 mg total) by mouth daily. 30 capsule 3  . sodium chloride (OCEAN) 0.65 % SOLN nasal spray Place 1 spray into both nostrils as needed for congestion. 15 mL 0  . EPINEPHrine (EPIPEN 2-PAK) 0.3 mg/0.3 mL IJ SOAJ injection Inject 0.3 mLs (0.3 mg total) into the muscle once. 2 Device 1  . fish oil-omega-3 fatty acids 1000 MG capsule Take 1 g by mouth daily.      Marland Kitchen amLODipine (NORVASC) 10 MG tablet amlodipine 10 mg tablet  Take 1 tablet every day by oral route.    Marland Kitchen losartan (COZAAR) 50 MG tablet Take 1 tablet (50 mg total) by mouth daily. (Patient not taking: Reported on 05/19/2017) 30 tablet 0   No facility-administered medications prior to visit.     ROS See HPI  Objective:  BP (!) 170/98   Pulse 79   Temp 98 F (36.7 C)   Wt 257 lb (116.6 kg)   SpO2 99%   BMI 42.77 kg/m   BP Readings from Last 3 Encounters:  06/06/17 (!) 170/98  05/19/17 (!) 160/94  05/03/16 136/88    Wt Readings from Last 3 Encounters:  06/06/17 257 lb (116.6 kg)  05/19/17 255 lb (115.7 kg)  05/03/16 247 lb 3.2 oz (112.1 kg)    Physical Exam  Constitutional: He is oriented to person, place, and time. No distress.    Cardiovascular: Normal rate, regular rhythm and normal heart sounds.  Pulmonary/Chest: Effort normal and breath sounds normal.  Musculoskeletal: He exhibits no edema.  Neurological: He is alert and oriented to person, place, and time.  Psychiatric: He has a normal mood and affect. His behavior is normal.  Vitals reviewed.   Lab Results  Component Value Date   WBC 5.3 09/01/2015   HGB 13.8 09/01/2015   HCT 40.9 09/01/2015   PLT 279 09/01/2015   GLUCOSE 110 (H) 05/19/2017   ALT 14 03/25/2015   AST 16 03/25/2015   NA 138 05/19/2017   K 4.1 05/19/2017   CL 104 05/19/2017   CREATININE 1.05 05/19/2017   BUN 12 05/19/2017   CO2 25 05/19/2017   TSH 3.61 03/25/2015   HGBA1C 5.9 05/19/2017    Ct Angio Head W/cm &/or Wo Cm  Result Date: 09/01/2015 CLINICAL DATA:  Severe RIGHT-sided headache beginning earlier today. EXAM: CT ANGIOGRAPHY HEAD AND NECK TECHNIQUE: Multidetector CT imaging of the head and neck was performed using the standard protocol during bolus administration of intravenous contrast. Multiplanar CT image reconstructions and MIPs were obtained to evaluate the vascular anatomy. Carotid stenosis measurements (when applicable) are obtained utilizing NASCET criteria, using the distal internal carotid diameter as the denominator. CONTRAST:  Isovue 370, 50  mL. COMPARISON:  CT head earlier today. FINDINGS: Slight venous contamination due to bolus timing difficulty. Overall study diagnostic CT HEAD Calvarium and skull base: No fracture or destructive lesion. Mastoids and middle ears are grossly clear. Paranasal sinuses: Changes of chronic sinusitis similar to 2013, with BILATERAL maxillary sinus retention cysts. Chronic sclerotic change in the RIGHT sphenoid, under aerated. No fluid level. Orbits: Negative. Brain: No evidence of acute abnormality, including acute infarct, hemorrhage, hydrocephalus, or mass lesion. CTA NECK Aortic arch: Standard branching. Imaged portion shows no evidence of  aneurysm or dissection. No significant stenosis of the major arch vessel origins. Right carotid system: No evidence of dissection, stenosis (50% or greater) or occlusion. Left carotid system: No evidence of dissection, stenosis (50% or greater) or occlusion. Vertebral arteries: LEFT vertebral dominant. RIGHT vertebral patent. No evidence of dissection, stenosis (50% or greater) or occlusion. Nonvascular soft tissues: Slight reversal of the normal cervical lordotic curve and cervical spine but no significant disc space narrowing. No neck masses. Lung apices clear. Trachea midline. CTA HEAD Anterior circulation: No significant stenosis, proximal occlusion, aneurysm, or vascular malformation. Posterior circulation: RIGHT vertebral is smaller but both contribute to basilar formation. No significant stenosis, proximal occlusion, aneurysm, or vascular malformation. Venous sinuses: As permitted by contrast timing, patent. Anatomic variants: None of significance. Delayed phase:   No abnormal intracranial enhancement. IMPRESSION: No extracranial or intracranial flow reducing lesion or dissection. No acute intracranial findings or postcontrast enhancement. Electronically Signed   By: Staci Righter M.D.   On: 09/01/2015 09:31   Ct Head Wo Contrast  Result Date: 09/01/2015 CLINICAL DATA:  Severe right-sided headache onset this morning. EXAM: CT HEAD WITHOUT CONTRAST TECHNIQUE: Contiguous axial images were obtained from the base of the skull through the vertex without intravenous contrast. COMPARISON:  12/20/2011 FINDINGS: Skull and Sinuses:Negative for fracture or destructive process. Chronic sinusitis with mucous retention cyst filling much of the left maxillary antrum and moderate right mucosal thickening. No fluid level is seen. Chronic ground-glass sclerosis in place of the right sphenoid sinus. Visualized orbits: Negative. Brain: Unremarkable. No evidence of acute infarction, hemorrhage, hydrocephalus, or mass  lesion/mass effect. IMPRESSION: 1. Negative intracranial imaging. 2. Chronic sinusitis that is similar to 2013 comparison. Electronically Signed   By: Monte Fantasia M.D.   On: 09/01/2015 05:22   Ct Angio Neck W/cm &/or Wo/cm  Result Date: 09/01/2015 CLINICAL DATA:  Severe RIGHT-sided headache beginning earlier today. EXAM: CT ANGIOGRAPHY HEAD AND NECK TECHNIQUE: Multidetector CT imaging of the head and neck was performed using the standard protocol during bolus administration of intravenous contrast. Multiplanar CT image reconstructions and MIPs were obtained to evaluate the vascular anatomy. Carotid stenosis measurements (when applicable) are obtained utilizing NASCET criteria, using the distal internal carotid diameter as the denominator. CONTRAST:  Isovue 370, 50 mL. COMPARISON:  CT head earlier today. FINDINGS: Slight venous contamination due to bolus timing difficulty. Overall study diagnostic CT HEAD Calvarium and skull base: No fracture or destructive lesion. Mastoids and middle ears are grossly clear. Paranasal sinuses: Changes of chronic sinusitis similar to 2013, with BILATERAL maxillary sinus retention cysts. Chronic sclerotic change in the RIGHT sphenoid, under aerated. No fluid level. Orbits: Negative. Brain: No evidence of acute abnormality, including acute infarct, hemorrhage, hydrocephalus, or mass lesion. CTA NECK Aortic arch: Standard branching. Imaged portion shows no evidence of aneurysm or dissection. No significant stenosis of the major arch vessel origins. Right carotid system: No evidence of dissection, stenosis (50% or greater) or occlusion. Left carotid system:  No evidence of dissection, stenosis (50% or greater) or occlusion. Vertebral arteries: LEFT vertebral dominant. RIGHT vertebral patent. No evidence of dissection, stenosis (50% or greater) or occlusion. Nonvascular soft tissues: Slight reversal of the normal cervical lordotic curve and cervical spine but no significant disc  space narrowing. No neck masses. Lung apices clear. Trachea midline. CTA HEAD Anterior circulation: No significant stenosis, proximal occlusion, aneurysm, or vascular malformation. Posterior circulation: RIGHT vertebral is smaller but both contribute to basilar formation. No significant stenosis, proximal occlusion, aneurysm, or vascular malformation. Venous sinuses: As permitted by contrast timing, patent. Anatomic variants: None of significance. Delayed phase:   No abnormal intracranial enhancement. IMPRESSION: No extracranial or intracranial flow reducing lesion or dissection. No acute intracranial findings or postcontrast enhancement. Electronically Signed   By: Staci Righter M.D.   On: 09/01/2015 09:31    Assessment & Plan:   Patrick Levine was seen today for follow-up.  Diagnoses and all orders for this visit:  Essential hypertension -     amLODipine (NORVASC) 5 MG tablet; Take 1 tablet (5 mg total) by mouth daily. -     chlorthalidone (HYGROTON) 25 MG tablet; Take 1 tablet (25 mg total) by mouth daily.  Allergy to bee sting -     EPINEPHrine (EPIPEN 2-PAK) 0.3 mg/0.3 mL IJ SOAJ injection; Inject 0.3 mLs (0.3 mg total) into the muscle once for 1 dose.  Laryngitis -     Ambulatory referral to ENT   I have discontinued [facility] [employer]'s losartan. I have also changed his EPINEPHrine and amLODipine. Additionally, I am having him start on chlorthalidone. Lastly, I am having him maintain his Multiple Vitamin (ONE-A-DAY MENS PO), fish oil-omega-3 fatty acids, omeprazole, fluticasone, and sodium chloride.  Meds ordered this encounter  Medications  . EPINEPHrine (EPIPEN 2-PAK) 0.3 mg/0.3 mL IJ SOAJ injection    Sig: Inject 0.3 mLs (0.3 mg total) into the muscle once for 1 dose.    Dispense:  2 Device    Refill:  0    Order Specific Question:   Supervising Provider    Answer:   Lucille Passy [[id number]]  . amLODipine (NORVASC) 5 MG tablet    Sig: Take 1 tablet (5 mg total) by mouth daily.     Dispense:  90 tablet    Refill:  0    Order Specific Question:   Supervising Provider    Answer:   Lucille Passy [[id number]]  . chlorthalidone (HYGROTON) 25 MG tablet    Sig: Take 1 tablet (25 mg total) by mouth daily.    Dispense:  30 tablet    Refill:  0    Order Specific Question:   Supervising Provider    Answer:   Lucille Passy [[id number]]    Follow-up: Return in about 2 weeks (around 06/20/2017) for CPE (fasting) and HTN re eval..  [name], NP

## 2017-06-06 NOTE — Patient Instructions (Addendum)
Check BP once a day and record. Bring BP readings to next office visit.  Maintain DASH diet. Start low impact exercise (walking 30-60mins per day).  You will be contacted to schedule appt with ENT.  Advised to stop ETOH and tobacco use.  DASH Eating Plan DASH stands for "Dietary Approaches to Stop Hypertension." The DASH eating plan is a healthy eating plan that has been shown to reduce high blood pressure (hypertension). It may also reduce your risk for type 2 diabetes, heart disease, and stroke. The DASH eating plan may also help with weight loss. What are tips for following this plan? General guidelines  Avoid eating more than 2,300 mg (milligrams) of salt (sodium) a day. If you have hypertension, you may need to reduce your sodium intake to 1,500 mg a day.  Limit alcohol intake to no more than 1 drink a day for nonpregnant women and 2 drinks a day for men. One drink equals 12 oz of beer, 5 oz of wine, or 1 oz of hard liquor.  Work with your health care provider to maintain a healthy body weight or to lose weight. Ask what an ideal weight is for you.  Get at least 30 minutes of exercise that causes your heart to beat faster (aerobic exercise) most days of the week. Activities may include walking, swimming, or biking.  Work with your health care provider or diet and nutrition specialist (dietitian) to adjust your eating plan to your individual calorie needs. Reading food labels  Check food labels for the amount of sodium per serving. Choose foods with less than 5 percent of the Daily Value of sodium. Generally, foods with less than 300 mg of sodium per serving fit into this eating plan.  To find whole grains, look for the word "whole" as the first word in the ingredient list. Shopping  Buy products labeled as "low-sodium" or "no salt added."  Buy fresh foods. Avoid canned foods and premade or frozen meals. Cooking  Avoid adding salt when cooking. Use salt-free seasonings or  herbs instead of table salt or sea salt. Check with your health care provider or pharmacist before using salt substitutes.  Do not fry foods. Cook foods using healthy methods such as baking, boiling, grilling, and broiling instead.  Cook with heart-healthy oils, such as olive, canola, soybean, or sunflower oil. Meal planning   Eat a balanced diet that includes: ? 5 or more servings of fruits and vegetables each day. At each meal, try to fill half of your plate with fruits and vegetables. ? Up to 6-8 servings of whole grains each day. ? Less than 6 oz of lean meat, poultry, or fish each day. A 3-oz serving of meat is about the same size as a deck of cards. One egg equals 1 oz. ? 2 servings of low-fat dairy each day. ? A serving of nuts, seeds, or beans 5 times each week. ? Heart-healthy fats. Healthy fats called Omega-3 fatty acids are found in foods such as flaxseeds and coldwater fish, like sardines, salmon, and mackerel.  Limit how much you eat of the following: ? Canned or prepackaged foods. ? Food that is high in trans fat, such as fried foods. ? Food that is high in saturated fat, such as fatty meat. ? Sweets, desserts, sugary drinks, and other foods with added sugar. ? Full-fat dairy products.  Do not salt foods before eating.  Try to eat at least 2 vegetarian meals each week.  Eat more home-cooked food and  less restaurant, buffet, and fast food.  When eating at a restaurant, ask that your food be prepared with less salt or no salt, if possible. What foods are recommended? The items listed may not be a complete list. Talk with your dietitian about what dietary choices are best for you. Grains Whole-grain or whole-wheat bread. Whole-grain or whole-wheat pasta. Brown rice. [place]. Bulgur. Whole-grain and low-sodium cereals. Pita bread. Low-fat, low-sodium crackers. Whole-wheat flour tortillas. Vegetables Fresh or frozen vegetables (raw, steamed, roasted, or grilled).  Low-sodium or reduced-sodium tomato and vegetable juice. Low-sodium or reduced-sodium tomato sauce and tomato paste. Low-sodium or reduced-sodium canned vegetables. Fruits All fresh, dried, or frozen fruit. Canned fruit in natural juice (without added sugar). Meat and other protein foods Skinless chicken or [place]. Ground chicken or [place]. Pork with fat trimmed off. Fish and seafood. Egg whites. Dried beans, peas, or lentils. Unsalted nuts, nut butters, and seeds. Unsalted canned beans. Lean cuts of beef with fat trimmed off. Low-sodium, lean deli meat. Dairy Low-fat (1%) or fat-free (skim) milk. Fat-free, low-fat, or reduced-fat cheeses. Nonfat, low-sodium ricotta or cottage cheese. Low-fat or nonfat yogurt. Low-fat, low-sodium cheese. Fats and oils Soft margarine without trans fats. Vegetable oil. Low-fat, reduced-fat, or light mayonnaise and salad dressings (reduced-sodium). Canola, safflower, olive, soybean, and sunflower oils. Avocado. Seasoning and other foods Herbs. Spices. Seasoning mixes without salt. Unsalted popcorn and pretzels. Fat-free sweets. What foods are not recommended? The items listed may not be a complete list. Talk with your dietitian about what dietary choices are best for you. Grains Baked goods made with fat, such as croissants, muffins, or some breads. Dry pasta or rice meal packs. Vegetables Creamed or fried vegetables. Vegetables in a cheese sauce. Regular canned vegetables (not low-sodium or reduced-sodium). Regular canned tomato sauce and paste (not low-sodium or reduced-sodium). Regular tomato and vegetable juice (not low-sodium or reduced-sodium). Angie Fava. Olives. Fruits Canned fruit in a light or heavy syrup. Fried fruit. Fruit in cream or butter sauce. Meat and other protein foods Fatty cuts of meat. Ribs. Fried meat. Berniece Salines. Sausage. Bologna and other processed lunch meats. Salami. Fatback. Hotdogs. Bratwurst. Salted nuts and seeds. Canned beans with added  salt. Canned or smoked fish. Whole eggs or egg yolks. Chicken or [place] with skin. Dairy Whole or 2% milk, cream, and half-and-half. Whole or full-fat cream cheese. Whole-fat or sweetened yogurt. Full-fat cheese. Nondairy creamers. Whipped toppings. Processed cheese and cheese spreads. Fats and oils Butter. Stick margarine. Lard. Shortening. Ghee. Bacon fat. Tropical oils, such as coconut, palm kernel, or palm oil. Seasoning and other foods Salted popcorn and pretzels. Onion salt, garlic salt, seasoned salt, table salt, and sea salt. Worcestershire sauce. Tartar sauce. Barbecue sauce. Teriyaki sauce. Soy sauce, including reduced-sodium. Steak sauce. Canned and packaged gravies. Fish sauce. Oyster sauce. Cocktail sauce. Horseradish that you find on the shelf. Ketchup. Mustard. Meat flavorings and tenderizers. Bouillon cubes. Hot sauce and Tabasco sauce. Premade or packaged marinades. Premade or packaged taco seasonings. Relishes. Regular salad dressings. Where to find more information:  National Heart, Lung, and [place]: https://wilson-eaton.com/  American Heart Association: www.heart.org Summary  The DASH eating plan is a healthy eating plan that has been shown to reduce high blood pressure (hypertension). It may also reduce your risk for type 2 diabetes, heart disease, and stroke.  With the DASH eating plan, you should limit salt (sodium) intake to 2,300 mg a day. If you have hypertension, you may need to reduce your sodium intake to 1,500 mg a day.  When on the DASH eating plan, aim to eat more fresh fruits and vegetables, whole grains, lean proteins, low-fat dairy, and heart-healthy fats.  Work with your health care provider or diet and nutrition specialist (dietitian) to adjust your eating plan to your individual calorie needs. This information is not intended to replace advice given to you by your health care provider. Make sure you discuss any questions you have with your health care  provider. Document Released: 03/03/2011 Document Revised: 03/07/2016 Document Reviewed: 03/07/2016 Elsevier Interactive Patient Education  [employer].

## 2017-06-15 ENCOUNTER — Other Ambulatory Visit: Payer: Self-pay | Admitting: Nurse Practitioner

## 2017-06-15 DIAGNOSIS — J069 Acute upper respiratory infection, unspecified: Secondary | ICD-10-CM

## 2017-06-15 DIAGNOSIS — J040 Acute laryngitis: Secondary | ICD-10-CM

## 2017-06-21 ENCOUNTER — Ambulatory Visit (INDEPENDENT_AMBULATORY_CARE_PROVIDER_SITE_OTHER): Payer: Managed Care, Other (non HMO) | Admitting: Nurse Practitioner

## 2017-06-21 ENCOUNTER — Encounter: Payer: Self-pay | Admitting: Nurse Practitioner

## 2017-06-21 VITALS — BP 170/98 | HR 83 | Temp 98.0°F | Ht 65.0 in | Wt 256.0 lb

## 2017-06-21 DIAGNOSIS — Z0001 Encounter for general adult medical examination with abnormal findings: Secondary | ICD-10-CM | POA: Diagnosis not present

## 2017-06-21 DIAGNOSIS — Z1211 Encounter for screening for malignant neoplasm of colon: Secondary | ICD-10-CM

## 2017-06-21 DIAGNOSIS — E782 Mixed hyperlipidemia: Secondary | ICD-10-CM | POA: Diagnosis not present

## 2017-06-21 DIAGNOSIS — I10 Essential (primary) hypertension: Secondary | ICD-10-CM

## 2017-06-21 DIAGNOSIS — Z114 Encounter for screening for human immunodeficiency virus [HIV]: Secondary | ICD-10-CM

## 2017-06-21 DIAGNOSIS — R400 Somnolence: Secondary | ICD-10-CM

## 2017-06-21 DIAGNOSIS — R0683 Snoring: Secondary | ICD-10-CM | POA: Diagnosis not present

## 2017-06-21 LAB — COMPREHENSIVE METABOLIC PANEL
ALK PHOS: 59 U/L (ref 39–117)
ALT: 26 U/L (ref 0–53)
AST: 21 U/L (ref 0–37)
Albumin: 4.4 g/dL (ref 3.5–5.2)
BILIRUBIN TOTAL: 0.3 mg/dL (ref 0.2–1.2)
BUN: 17 mg/dL (ref 6–23)
CO2: 29 mEq/L (ref 19–32)
Calcium: 10.1 mg/dL (ref 8.4–10.5)
Chloride: 100 mEq/L (ref 96–112)
Creatinine, Ser: 1.04 mg/dL (ref 0.40–1.50)
GFR: 100.63 mL/min (ref >60.00)
GLUCOSE: 120 mg/dL — AB (ref 70–99)
Potassium: 4 mEq/L (ref 3.5–5.1)
Sodium: 138 mEq/L (ref 135–145)
TOTAL PROTEIN: 7.7 g/dL (ref 6.0–8.3)

## 2017-06-21 LAB — LIPID PANEL
CHOLESTEROL: 262 mg/dL — AB (ref 0–200)
HDL: 47.9 mg/dL (ref >39.00)
NonHDL: 214.41
TRIGLYCERIDES: 250 mg/dL — AB (ref 0.0–149.0)
Total CHOL/HDL Ratio: 5
VLDL: 50 mg/dL — ABNORMAL HIGH (ref 0.0–40.0)

## 2017-06-21 LAB — CBC
HCT: 43.9 % (ref 39.0–52.0)
Hemoglobin: 14.8 g/dL (ref 13.0–17.0)
MCHC: 33.6 g/dL (ref 30.0–36.0)
MCV: 87.1 fl (ref 78.0–100.0)
PLATELETS: 314 10*3/uL (ref 150.0–400.0)
RBC: 5.04 Mil/uL (ref 4.22–5.81)
RDW: 13.6 % (ref 11.5–15.5)
WBC: 6.1 10*3/uL (ref 4.0–10.5)

## 2017-06-21 LAB — LDL CHOLESTEROL, DIRECT: Direct LDL: 175 mg/dL

## 2017-06-21 LAB — TSH: TSH: 1.04 u[IU]/mL (ref 0.35–4.50)

## 2017-06-21 MED ORDER — AMLODIPINE BESYLATE 10 MG PO TABS: 10.0000 mg | ORAL_TABLET | Freq: Every day | ORAL | 1 refills | Status: DC

## 2017-06-21 MED ORDER — AZILSARTAN-CHLORTHALIDONE 40-12.5 MG PO TABS: 1.0000 | ORAL_TABLET | Freq: Every day | ORAL | 2 refills | Status: DC

## 2017-06-21 NOTE — Progress Notes (Signed)
Subjective:    Patient ID: Patrick Levine, male    DOB: 1975-10-13, 42 y.o.   MRN: [id number]  Patient presents today for complete physical  HPI  HTN: uncontrolled with amlodipine 10mg  and chlorthialidone 25mg . Admits to not following DASH diet. Admits to persistent tobacco use. Does not check BP at home even though he has machine. BP Readings from Last 3 Encounters:  06/21/17 (!) 170/98  06/06/17 (!) 170/98  05/19/17 (!) 160/94  reports snoring and observed apneic episodes by wife. Daytime somnolence especially after a big meal.  Laryngitis: Reports some improvement with omeprazole. Still waiting for appt with ENT.  Immunizations: (TDAP, Hep C screen, Pneumovax, Influenza, zoster)  Health Maintenance  Topic Date Due  . HIV Screening  04/12/1990  . Tetanus Vaccine  12/25/2020  . Flu Shot  Completed   Diet:regular.  Weight:  Wt Readings from Last 3 Encounters:  06/21/17 256 lb (116.1 kg)  06/06/17 257 lb (116.6 kg)  05/19/17 255 lb (115.7 kg)    Exercise:none.  Fall Risk: Fall Risk  06/21/2017 05/19/2017 05/03/2016  Falls in the past year? No Yes No  Number falls in past yr: - 1 -  Injury with Fall? - Yes -   Home Safety:home with wife.  Depression/Suicide: Depression screen [facility] 2/9 06/21/2017 05/19/2017 05/03/2016  Decreased Interest 0 0 0  Down, Depressed, Hopeless 0 0 0  PHQ - 2 Score 0 0 0   Vision:will schedule.  Dental:will schedule.  Advanced Directive: Advanced Directives 09/01/2015  Does Patient Have a Medical Advance Directive? No  Would patient like information on creating a medical advance directive? -   Sexual History (birth control, marital status, STD):married and sexually active  Medications and allergies reviewed with patient and updated if appropriate.  Patient Active Problem List   Diagnosis Date Noted  . Hyperlipidemia 06/22/2017  . Hyperglycemia 06/22/2017  . Laryngitis 05/19/2017  . Numbness in both hands 03/25/2015  . 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 Fracture of ribs, multiple, closed 12/26/2011  . HTN (hypertension) 12/26/2011    Current Outpatient Medications on File Prior to Visit  Medication Sig Dispense Refill  . fluticasone (FLONASE) 50 MCG/ACT nasal spray SPRAY 2 SPRAYS INTO EACH NOSTRIL EVERY DAY 16 g 0  . Multiple Vitamin (ONE-A-DAY MENS PO) Take 1 tablet by mouth.      Marland Kitchen omeprazole (PRILOSEC) 20 MG capsule Take 1 capsule (20 mg total) by mouth daily. 30 capsule 3  . sodium chloride (OCEAN) 0.65 % SOLN nasal spray Place 1 spray into both nostrils as needed for congestion. 15 mL 0   No current facility-administered medications on file prior to visit.     Past Medical History:  Diagnosis Date  . Alcohol addiction ([place])   . Hypertension     Past Surgical History:  Procedure Laterality Date  . LACERATION REPAIR  2001   lip laceration--stitches.    Social History   Socioeconomic History  . Marital status: Married    Spouse name: Not on file  . Number of children: 4  . Years of education: Not on file  . Highest education level: Not on file  Occupational History  . Occupation: MENTAL HEALTH/CASE MANAGER    Employer: [employer]  . Financial resource strain: Not on file  . Food insecurity:    Worry: Not on file    Inability: Not on file  . Transportation needs:  Medical: Not on file    Non-medical: Not on file  Tobacco Use  . Smoking status: Current Some Day Smoker    Packs/day: 0.50    Types: Cigarettes  . Smokeless tobacco: Never Used  Substance and Sexual Activity  . Alcohol use: Yes    Alcohol/week: 3.6 oz    Types: 4 Cans of beer, 2 Shots of liquor per week    Comment: 6-8 drinks/day  . Drug use: No    Types: "Crack" cocaine    Comment: remote use of cocaine and marijuana  . Sexual activity: Yes  Lifestyle  . Physical activity:    Days per week: Not on file    Minutes per session: Not on file  .  Stress: Not on file  Relationships  . Social connections:    Talks on phone: Not on file    Gets together: Not on file    Attends religious service: Not on file    Active member of club or organization: Not on file    Attends meetings of clubs or organizations: Not on file    Relationship status: Not on file  Other Topics Concern  . 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   Married       Family History  Problem Relation Age of Onset  . Hypertension Father   . Hypertension Paternal Grandfather         Review of Systems  Constitutional: Negative for fever, malaise/fatigue and weight loss.  HENT: Negative for congestion and sore throat.   Eyes:       Negative for visual changes  Respiratory: Negative for cough and shortness of breath.   Cardiovascular: Negative for chest pain, palpitations and leg swelling.  Gastrointestinal: Negative for blood in stool, constipation, diarrhea and heartburn.  Genitourinary: Negative for dysuria, frequency and urgency.  Musculoskeletal: Negative for falls, joint pain and myalgias.  Skin: Negative for rash.  Neurological: Negative for dizziness, sensory change and headaches.  Endo/Heme/Allergies: Does not bruise/bleed easily.  Psychiatric/Behavioral: Negative for depression, substance abuse and suicidal ideas. The patient is not nervous/anxious.     Objective:   Vitals:   06/21/17 0911  BP: (!) 170/98  Pulse: 83  Temp: 98 F (36.7 C)  SpO2: 97%    Body mass index is 42.6 kg/m.   Physical Examination:  Physical Exam  Constitutional: He is oriented to person, place, and time and well-developed, well-nourished, and in no distress. No distress.  HENT:  Right Ear: Tympanic membrane, external ear and ear canal normal.  Left Ear: Tympanic membrane, external ear and ear canal normal.    Nose: Mucosal edema present. No rhinorrhea. Right sinus exhibits no maxillary sinus tenderness and no frontal sinus tenderness. Left sinus exhibits no maxillary sinus tenderness and no frontal sinus tenderness.  Mouth/Throat: No trismus in the jaw. Posterior oropharyngeal erythema present. No oropharyngeal exudate.  Eyes: Pupils are equal, round, and reactive to light. Conjunctivae and EOM are normal. No scleral icterus.  Neck: Normal range of motion. Neck supple. No thyromegaly present.  Cardiovascular: Normal rate, regular rhythm, normal heart sounds and intact distal pulses.  Pulmonary/Chest: Effort normal and breath sounds normal.  Abdominal: Bowel sounds are normal. He exhibits no distension. There is no tenderness.  Genitourinary: Rectum normal, prostate normal and penis normal. Rectal exam shows guaiac negative stool.  No discharge found.  Musculoskeletal: Normal range of motion. He exhibits no edema or tenderness.  Lymphadenopathy:    He has no cervical adenopathy.  Neurological: He is alert and oriented to person, place, and time. He has normal reflexes. No cranial nerve deficit. Gait normal.  Skin: Skin is warm and dry.  Psychiatric: Affect and judgment normal.  Vitals reviewed.   ASSESSMENT and PLAN:  [place] was seen today for annual exam.  Diagnoses and all orders for this visit:  Encounter for preventative adult health care exam with abnormal findings -     Comprehensive metabolic panel -     CBC -     TSH -     Lipid panel -     HIV antibody  Essential hypertension -     Ambulatory referral to Neurology -     amLODipine (NORVASC) 10 MG tablet; Take 1 tablet (10 mg total) by mouth daily. -     Azilsartan-Chlorthalidone 40-12.5 MG TABS; Take 1 tablet by mouth daily.  Daytime somnolence -     Ambulatory referral to Neurology  Snoring -     Ambulatory referral to Neurology  Colon cancer screening -     IFOBT POC (occult bld, rslt in office); Future  Encounter for  screening for human immunodeficiency virus (HIV) -     HIV antibody  Mixed hyperlipidemia -     omega-3 acid ethyl esters (LOVAZA) 1 g capsule; Take 1 capsule (1 g total) by mouth 2 (two) times daily. -     pravastatin (PRAVACHOL) 20 MG tablet; Take 1 tablet (20 mg total) by mouth daily.  Other orders -     LDL cholesterol, direct   No problem-specific Assessment & Plan notes found for this encounter.     Follow up: Return in about 1 month (around 07/19/2017) for HTN.  [name], NP

## 2017-06-21 NOTE — Patient Instructions (Addendum)
Maintain amlodipine at 10mg   I have stopped chlorthialidone.  Start azilsartan/chlorthialidone 1tab daily.  I strongly recommend tobacco use cessation and DASH diet. Also start walking daily (30-67mins).  Check BP at home 3times a week and record Bring BP reading to next office visit.  You will be contacted to schedule appt for sleep study.  [place] [facility] [facility] will call you to schedule appt for laryngitis evaluation.  Elevated lipid levels. Need to start cholesterol lowering agent to compliment heart healthy diet and regular exercise. Rx sent. Stable CMP with elevated glucose which indicates prediabetes. Need to make changes to diet and regular exercise as discussed. Normal TSH and CBC. Pending HIV  DASH Eating Plan DASH stands for "Dietary Approaches to Stop Hypertension." The DASH eating plan is a healthy eating plan that has been shown to reduce high blood pressure (hypertension). It may also reduce your risk for type 2 diabetes, heart disease, and stroke. The DASH eating plan may also help with weight loss. What are tips for following this plan? General guidelines  Avoid eating more than 2,300 mg (milligrams) of salt (sodium) a day. If you have hypertension, you may need to reduce your sodium intake to 1,500 mg a day.  Limit alcohol intake to no more than 1 drink a day for nonpregnant women and 2 drinks a day for men. One drink equals 12 oz of beer, 5 oz of wine, or 1 oz of hard liquor.  Work with your health care provider to maintain a healthy body weight or to lose weight. Ask what an ideal weight is for you.  Get at least 30 minutes of exercise that causes your heart to beat faster (aerobic exercise) most days of the week. Activities may include walking, swimming, or biking.  Work with your health care provider or diet and nutrition specialist (dietitian) to adjust your eating plan to your individual calorie needs. Reading food labels  Check food labels for the amount of sodium per  serving. Choose foods with less than 5 percent of the Daily Value of sodium. Generally, foods with less than 300 mg of sodium per serving fit into this eating plan.  To find whole grains, look for the word "whole" as the first word in the ingredient list. Shopping  Buy products labeled as "low-sodium" or "no salt added."  Buy fresh foods. Avoid canned foods and premade or frozen meals. Cooking  Avoid adding salt when cooking. Use salt-free seasonings or herbs instead of table salt or sea salt. Check with your health care provider or pharmacist before using salt substitutes.  Do not fry foods. Cook foods using healthy methods such as baking, boiling, grilling, and broiling instead.  Cook with heart-healthy oils, such as olive, canola, soybean, or sunflower oil. Meal planning   Eat a balanced diet that includes: ? 5 or more servings of fruits and vegetables each day. At each meal, try to fill half of your plate with fruits and vegetables. ? Up to 6-8 servings of whole grains each day. ? Less than 6 oz of lean meat, poultry, or fish each day. A 3-oz serving of meat is about the same size as a deck of cards. One egg equals 1 oz. ? 2 servings of low-fat dairy each day. ? A serving of nuts, seeds, or beans 5 times each week. ? Heart-healthy fats. Healthy fats called Omega-3 fatty acids are found in foods such as flaxseeds and coldwater fish, like sardines, salmon, and mackerel.  Limit how much you eat of  the following: ? Canned or prepackaged foods. ? Food that is high in trans fat, such as fried foods. ? Food that is high in saturated fat, such as fatty meat. ? Sweets, desserts, sugary drinks, and other foods with added sugar. ? Full-fat dairy products.  Do not salt foods before eating.  Try to eat at least 2 vegetarian meals each week.  Eat more home-cooked food and less restaurant, buffet, and fast food.  When eating at a restaurant, ask that your food be prepared with less salt  or no salt, if possible. What foods are recommended? The items listed may not be a complete list. Talk with your dietitian about what dietary choices are best for you. Grains Whole-grain or whole-wheat bread. Whole-grain or whole-wheat pasta. Brown rice. [place]. Bulgur. Whole-grain and low-sodium cereals. Pita bread. Low-fat, low-sodium crackers. Whole-wheat flour tortillas. Vegetables Fresh or frozen vegetables (raw, steamed, roasted, or grilled). Low-sodium or reduced-sodium tomato and vegetable juice. Low-sodium or reduced-sodium tomato sauce and tomato paste. Low-sodium or reduced-sodium canned vegetables. Fruits All fresh, dried, or frozen fruit. Canned fruit in natural juice (without added sugar). Meat and other protein foods Skinless chicken or [place]. Ground chicken or [place]. Pork with fat trimmed off. Fish and seafood. Egg whites. Dried beans, peas, or lentils. Unsalted nuts, nut butters, and seeds. Unsalted canned beans. Lean cuts of beef with fat trimmed off. Low-sodium, lean deli meat. Dairy Low-fat (1%) or fat-free (skim) milk. Fat-free, low-fat, or reduced-fat cheeses. Nonfat, low-sodium ricotta or cottage cheese. Low-fat or nonfat yogurt. Low-fat, low-sodium cheese. Fats and oils Soft margarine without trans fats. Vegetable oil. Low-fat, reduced-fat, or light mayonnaise and salad dressings (reduced-sodium). Canola, safflower, olive, soybean, and sunflower oils. Avocado. Seasoning and other foods Herbs. Spices. Seasoning mixes without salt. Unsalted popcorn and pretzels. Fat-free sweets. What foods are not recommended? The items listed may not be a complete list. Talk with your dietitian about what dietary choices are best for you. Grains Baked goods made with fat, such as croissants, muffins, or some breads. Dry pasta or rice meal packs. Vegetables Creamed or fried vegetables. Vegetables in a cheese sauce. Regular canned vegetables (not low-sodium or reduced-sodium).  Regular canned tomato sauce and paste (not low-sodium or reduced-sodium). Regular tomato and vegetable juice (not low-sodium or reduced-sodium). Angie Fava. Olives. Fruits Canned fruit in a light or heavy syrup. Fried fruit. Fruit in cream or butter sauce. Meat and other protein foods Fatty cuts of meat. Ribs. Fried meat. Berniece Salines. Sausage. Bologna and other processed lunch meats. Salami. Fatback. Hotdogs. Bratwurst. Salted nuts and seeds. Canned beans with added salt. Canned or smoked fish. Whole eggs or egg yolks. Chicken or [place] with skin. Dairy Whole or 2% milk, cream, and half-and-half. Whole or full-fat cream cheese. Whole-fat or sweetened yogurt. Full-fat cheese. Nondairy creamers. Whipped toppings. Processed cheese and cheese spreads. Fats and oils Butter. Stick margarine. Lard. Shortening. Ghee. Bacon fat. Tropical oils, such as coconut, palm kernel, or palm oil. Seasoning and other foods Salted popcorn and pretzels. Onion salt, garlic salt, seasoned salt, table salt, and sea salt. Worcestershire sauce. Tartar sauce. Barbecue sauce. Teriyaki sauce. Soy sauce, including reduced-sodium. Steak sauce. Canned and packaged gravies. Fish sauce. Oyster sauce. Cocktail sauce. Horseradish that you find on the shelf. Ketchup. Mustard. Meat flavorings and tenderizers. Bouillon cubes. Hot sauce and Tabasco sauce. Premade or packaged marinades. Premade or packaged taco seasonings. Relishes. Regular salad dressings. Where to find more information:  National Heart, Lung, and [place]: https://wilson-eaton.com/  American Heart Association:  www.heart.org Summary  The DASH eating plan is a healthy eating plan that has been shown to reduce high blood pressure (hypertension). It may also reduce your risk for type 2 diabetes, heart disease, and stroke.  With the DASH eating plan, you should limit salt (sodium) intake to 2,300 mg a day. If you have hypertension, you may need to reduce your sodium intake to 1,500 mg  a day.  When on the DASH eating plan, aim to eat more fresh fruits and vegetables, whole grains, lean proteins, low-fat dairy, and heart-healthy fats.  Work with your health care provider or diet and nutrition specialist (dietitian) to adjust your eating plan to your individual calorie needs. This information is not intended to replace advice given to you by your health care provider. Make sure you discuss any questions you have with your health care provider. Document Released: 03/03/2011 Document Revised: 03/07/2016 Document Reviewed: 03/07/2016 Elsevier Interactive Patient Education  [employer].

## 2017-06-22 DIAGNOSIS — R739 Hyperglycemia, unspecified: Secondary | ICD-10-CM | POA: Insufficient documentation

## 2017-06-22 LAB — HIV ANTIBODY (ROUTINE TESTING W REFLEX): HIV 1&2 Ab, 4th Generation: NONREACTIVE

## 2017-06-22 MED ORDER — OMEGA-3-ACID ETHYL ESTERS 1 G PO CAPS
1.0000 g | ORAL_CAPSULE | Freq: Two times a day (BID) | ORAL | 3 refills | Status: DC
Start: 2017-06-22 — End: 2017-10-12

## 2017-06-22 MED ORDER — PRAVASTATIN SODIUM 20 MG PO TABS: 20.0000 mg | ORAL_TABLET | Freq: Every day | ORAL | 2 refills | Status: DC

## 2017-06-26 ENCOUNTER — Ambulatory Visit (INDEPENDENT_AMBULATORY_CARE_PROVIDER_SITE_OTHER): Payer: Managed Care, Other (non HMO) | Admitting: Nurse Practitioner

## 2017-06-26 ENCOUNTER — Encounter: Payer: Self-pay | Admitting: Nurse Practitioner

## 2017-06-26 VITALS — BP 142/92 | HR 77 | Temp 97.6°F | Ht 65.0 in | Wt 254.8 lb

## 2017-06-26 DIAGNOSIS — L02439 Carbuncle of limb, unspecified: Secondary | ICD-10-CM | POA: Diagnosis not present

## 2017-06-26 MED ORDER — SULFAMETHOXAZOLE-TRIMETHOPRIM 800-160 MG PO TABS: 1.0000 | ORAL_TABLET | Freq: Two times a day (BID) | ORAL | 0 refills | Status: DC

## 2017-06-26 NOTE — Progress Notes (Signed)
Subjective:  Patient ID: Patrick Levine, male    DOB: 11-21-1975  Age: 42 y.o. MRN: [id number]  CC: Leg Pain (developed pain in groin area over last week., not sure when it happened or how. PT declined referral for Dr Raeford Razor)   Rash  This is a new problem. The current episode started in the past 7 days. The problem has been gradually worsening since onset. The affected locations include the right upper leg. The rash is characterized by pain and swelling. He was exposed to nothing. Pertinent negatives include no fatigue, fever or joint pain. Past treatments include nothing.   Outpatient Medications Prior to Visit  Medication Sig Dispense Refill  . amLODipine (NORVASC) 10 MG tablet Take 1 tablet (10 mg total) by mouth daily. 90 tablet 1  . Azilsartan-Chlorthalidone 40-12.5 MG TABS Take 1 tablet by mouth daily. 30 tablet 2  . fluticasone (FLONASE) 50 MCG/ACT nasal spray SPRAY 2 SPRAYS INTO EACH NOSTRIL EVERY DAY 16 g 0  . Multiple Vitamin (ONE-A-DAY MENS PO) Take 1 tablet by mouth.      . omega-3 acid ethyl esters (LOVAZA) 1 g capsule Take 1 capsule (1 g total) by mouth 2 (two) times daily. 60 capsule 3  . omeprazole (PRILOSEC) 20 MG capsule Take 1 capsule (20 mg total) by mouth daily. 30 capsule 3  . pravastatin (PRAVACHOL) 20 MG tablet Take 1 tablet (20 mg total) by mouth daily. 30 tablet 2  . sodium chloride (OCEAN) 0.65 % SOLN nasal spray Place 1 spray into both nostrils as needed for congestion. 15 mL 0   No facility-administered medications prior to visit.     ROS See HPI  Objective:  BP (!) 142/92 (BP Location: Right Arm, Patient Position: Sitting, Cuff Size: Large)   Pulse 77   Temp 97.6 F (36.4 C) (Oral)   Ht 5\' 5"  (1.651 m)   Wt 254 lb 12.8 oz (115.6 kg)   SpO2 92%   BMI 42.40 kg/m   BP Readings from Last 3 Encounters:  06/26/17 (!) 142/92  06/21/17 (!) 170/98  06/06/17 (!) 170/98    Wt Readings from Last 3 Encounters:  06/26/17 254 lb 12.8 oz (115.6 kg)    06/21/17 256 lb (116.1 kg)  06/06/17 257 lb (116.6 kg)    Physical Exam  Constitutional: He is oriented to person, place, and time.  Cardiovascular: Normal rate.  Pulmonary/Chest: Effort normal.  Musculoskeletal: He exhibits no edema or tenderness.  Neurological: He is alert and oriented to person, place, and time.  Skin:     Vitals reviewed.   Lab Results  Component Value Date   WBC 6.1 06/21/2017   HGB 14.8 06/21/2017   HCT 43.9 06/21/2017   PLT 314.0 06/21/2017   GLUCOSE 120 (H) 06/21/2017   CHOL 262 (H) 06/21/2017   TRIG 250.0 (H) 06/21/2017   HDL 47.90 06/21/2017   LDLDIRECT 175.0 06/21/2017   ALT 26 06/21/2017   AST 21 06/21/2017   NA 138 06/21/2017   K 4.0 06/21/2017   CL 100 06/21/2017   CREATININE 1.04 06/21/2017   BUN 17 06/21/2017   CO2 29 06/21/2017   TSH 1.04 06/21/2017   HGBA1C 5.9 05/19/2017    Ct Angio Head W/cm &/or Wo Cm  Result Date: 09/01/2015 CLINICAL DATA:  Severe RIGHT-sided headache beginning earlier today. EXAM: CT ANGIOGRAPHY HEAD AND NECK TECHNIQUE: Multidetector CT imaging of the head and neck was performed using the standard protocol during bolus administration of intravenous contrast. Multiplanar CT image  reconstructions and MIPs were obtained to evaluate the vascular anatomy. Carotid stenosis measurements (when applicable) are obtained utilizing NASCET criteria, using the distal internal carotid diameter as the denominator. CONTRAST:  Isovue 370, 50 mL. COMPARISON:  CT head earlier today. FINDINGS: Slight venous contamination due to bolus timing difficulty. Overall study diagnostic CT HEAD Calvarium and skull base: No fracture or destructive lesion. Mastoids and middle ears are grossly clear. Paranasal sinuses: Changes of chronic sinusitis similar to 2013, with BILATERAL maxillary sinus retention cysts. Chronic sclerotic change in the RIGHT sphenoid, under aerated. No fluid level. Orbits: Negative. Brain: No evidence of acute abnormality,  including acute infarct, hemorrhage, hydrocephalus, or mass lesion. CTA NECK Aortic arch: Standard branching. Imaged portion shows no evidence of aneurysm or dissection. No significant stenosis of the major arch vessel origins. Right carotid system: No evidence of dissection, stenosis (50% or greater) or occlusion. Left carotid system: No evidence of dissection, stenosis (50% or greater) or occlusion. Vertebral arteries: LEFT vertebral dominant. RIGHT vertebral patent. No evidence of dissection, stenosis (50% or greater) or occlusion. Nonvascular soft tissues: Slight reversal of the normal cervical lordotic curve and cervical spine but no significant disc space narrowing. No neck masses. Lung apices clear. Trachea midline. CTA HEAD Anterior circulation: No significant stenosis, proximal occlusion, aneurysm, or vascular malformation. Posterior circulation: RIGHT vertebral is smaller but both contribute to basilar formation. No significant stenosis, proximal occlusion, aneurysm, or vascular malformation. Venous sinuses: As permitted by contrast timing, patent. Anatomic variants: None of significance. Delayed phase:   No abnormal intracranial enhancement. IMPRESSION: No extracranial or intracranial flow reducing lesion or dissection. No acute intracranial findings or postcontrast enhancement. Electronically Signed   By: Staci Righter M.D.   On: 09/01/2015 09:31   Ct Head Wo Contrast  Result Date: 09/01/2015 CLINICAL DATA:  Severe right-sided headache onset this morning. EXAM: CT HEAD WITHOUT CONTRAST TECHNIQUE: Contiguous axial images were obtained from the base of the skull through the vertex without intravenous contrast. COMPARISON:  12/20/2011 FINDINGS: Skull and Sinuses:Negative for fracture or destructive process. Chronic sinusitis with mucous retention cyst filling much of the left maxillary antrum and moderate right mucosal thickening. No fluid level is seen. Chronic ground-glass sclerosis in place of the  right sphenoid sinus. Visualized orbits: Negative. Brain: Unremarkable. No evidence of acute infarction, hemorrhage, hydrocephalus, or mass lesion/mass effect. IMPRESSION: 1. Negative intracranial imaging. 2. Chronic sinusitis that is similar to 2013 comparison. Electronically Signed   By: Monte Fantasia M.D.   On: 09/01/2015 05:22   Ct Angio Neck W/cm &/or Wo/cm  Result Date: 09/01/2015 CLINICAL DATA:  Severe RIGHT-sided headache beginning earlier today. EXAM: CT ANGIOGRAPHY HEAD AND NECK TECHNIQUE: Multidetector CT imaging of the head and neck was performed using the standard protocol during bolus administration of intravenous contrast. Multiplanar CT image reconstructions and MIPs were obtained to evaluate the vascular anatomy. Carotid stenosis measurements (when applicable) are obtained utilizing NASCET criteria, using the distal internal carotid diameter as the denominator. CONTRAST:  Isovue 370, 50 mL. COMPARISON:  CT head earlier today. FINDINGS: Slight venous contamination due to bolus timing difficulty. Overall study diagnostic CT HEAD Calvarium and skull base: No fracture or destructive lesion. Mastoids and middle ears are grossly clear. Paranasal sinuses: Changes of chronic sinusitis similar to 2013, with BILATERAL maxillary sinus retention cysts. Chronic sclerotic change in the RIGHT sphenoid, under aerated. No fluid level. Orbits: Negative. Brain: No evidence of acute abnormality, including acute infarct, hemorrhage, hydrocephalus, or mass lesion. CTA NECK Aortic arch: Standard branching.  Imaged portion shows no evidence of aneurysm or dissection. No significant stenosis of the major arch vessel origins. Right carotid system: No evidence of dissection, stenosis (50% or greater) or occlusion. Left carotid system: No evidence of dissection, stenosis (50% or greater) or occlusion. Vertebral arteries: LEFT vertebral dominant. RIGHT vertebral patent. No evidence of dissection, stenosis (50% or greater)  or occlusion. Nonvascular soft tissues: Slight reversal of the normal cervical lordotic curve and cervical spine but no significant disc space narrowing. No neck masses. Lung apices clear. Trachea midline. CTA HEAD Anterior circulation: No significant stenosis, proximal occlusion, aneurysm, or vascular malformation. Posterior circulation: RIGHT vertebral is smaller but both contribute to basilar formation. No significant stenosis, proximal occlusion, aneurysm, or vascular malformation. Venous sinuses: As permitted by contrast timing, patent. Anatomic variants: None of significance. Delayed phase:   No abnormal intracranial enhancement. IMPRESSION: No extracranial or intracranial flow reducing lesion or dissection. No acute intracranial findings or postcontrast enhancement. Electronically Signed   By: Staci Righter M.D.   On: 09/01/2015 09:31    Assessment & Plan:   Jermey was seen today for leg pain.  Diagnoses and all orders for this visit:  Carbuncle of thigh -     sulfamethoxazole-trimethoprim (BACTRIM DS,SEPTRA DS) 800-160 MG tablet; Take 1 tablet by mouth 2 (two) times daily. With food   I am having Patrick Levine start on sulfamethoxazole-trimethoprim. I am also having him maintain his Multiple Vitamin (ONE-A-DAY MENS PO), omeprazole, sodium chloride, fluticasone, amLODipine, Azilsartan-Chlorthalidone, omega-3 acid ethyl esters, and pravastatin.  Meds ordered this encounter  Medications  . sulfamethoxazole-trimethoprim (BACTRIM DS,SEPTRA DS) 800-160 MG tablet    Sig: Take 1 tablet by mouth 2 (two) times daily. With food    Dispense:  14 tablet    Refill:  0    Order Specific Question:   Supervising Provider    Answer:   Lucille Passy [[id number]]    Follow-up: No follow-ups on file.  [name], NP

## 2017-06-26 NOTE — Patient Instructions (Signed)
Use warm compress 2-3times a day (10-28mins)  Return to office if no improvement in 1week.  Skin Abscess A skin abscess is an infected area on or under your skin that contains pus and other material. An abscess can happen almost anywhere on your body. Some abscesses break open (rupture) on their own. Most continue to get worse unless they are treated. The infection can spread deeper into the body and into your blood, which can make you feel sick. Treatment usually involves draining the abscess. Follow these instructions at home: Abscess Care  If you have an abscess that has not drained, place a warm, clean, wet washcloth over the abscess several times a day. Do this as told by your doctor.  Follow instructions from your doctor about how to take care of your abscess. Make sure you: ? Cover the abscess with a bandage (dressing). ? Change your bandage or gauze as told by your doctor. ? Wash your hands with soap and water before you change the bandage or gauze. If you cannot use soap and water, use hand sanitizer.  Check your abscess every day for signs that the infection is getting worse. Check for: ? More redness, swelling, or pain. ? More fluid or blood. ? Warmth. ? More pus or a bad smell. Medicines   Take over-the-counter and prescription medicines only as told by your doctor.  If you were prescribed an antibiotic medicine, take it as told by your doctor. Do not stop taking the antibiotic even if you start to feel better. General instructions  To avoid spreading the infection: ? Do not share personal care items, towels, or hot tubs with others. ? Avoid making skin-to-skin contact with other people.  Keep all follow-up visits as told by your doctor. This is important. Contact a doctor if:  You have more redness, swelling, or pain around your abscess.  You have more fluid or blood coming from your abscess.  Your abscess feels warm when you touch it.  You have more pus or a  bad smell coming from your abscess.  You have a fever.  Your muscles ache.  You have chills.  You feel sick. Get help right away if:  You have very bad (severe) pain.  You see red streaks on your skin spreading away from the abscess. This information is not intended to replace advice given to you by your health care provider. Make sure you discuss any questions you have with your health care provider. Document Released: 08/31/2007 Document Revised: 11/08/2015 Document Reviewed: 01/21/2015 Elsevier Interactive Patient Education  [employer].

## 2017-06-28 ENCOUNTER — Telehealth: Payer: Self-pay

## 2017-06-28 NOTE — Telephone Encounter (Signed)
  Copied from [place] [id number]. Topic: General - Other >> Jun 28, 2017  8:29 AM Carolyn Stare wrote:  Pt call to say the below med is causing him joint pain and muscle spasm. Would like a call back    [phone]

## 2017-06-28 NOTE — Telephone Encounter (Signed)
Which medication is he referring to?

## 2017-06-28 NOTE — Telephone Encounter (Signed)
Baldo Ash can you advise me?

## 2017-06-29 NOTE — Telephone Encounter (Signed)
Left VM for Pt to return call and let us know what medication he is referring to.

## 2017-07-03 ENCOUNTER — Other Ambulatory Visit: Payer: Self-pay | Admitting: Nurse Practitioner

## 2017-07-03 DIAGNOSIS — I10 Essential (primary) hypertension: Secondary | ICD-10-CM

## 2017-07-03 NOTE — Telephone Encounter (Signed)
Left VM for pt to return call in reference to what medication he was talking about.

## 2017-07-24 ENCOUNTER — Ambulatory Visit (INDEPENDENT_AMBULATORY_CARE_PROVIDER_SITE_OTHER): Payer: Managed Care, Other (non HMO) | Admitting: Nurse Practitioner

## 2017-07-24 ENCOUNTER — Encounter: Payer: Self-pay | Admitting: Nurse Practitioner

## 2017-07-24 VITALS — BP 132/84 | HR 73 | Temp 97.8°F | Ht 65.0 in | Wt 252.0 lb

## 2017-07-24 DIAGNOSIS — Z91030 Bee allergy status: Secondary | ICD-10-CM | POA: Diagnosis not present

## 2017-07-24 DIAGNOSIS — E782 Mixed hyperlipidemia: Secondary | ICD-10-CM

## 2017-07-24 DIAGNOSIS — J040 Acute laryngitis: Secondary | ICD-10-CM | POA: Diagnosis not present

## 2017-07-24 DIAGNOSIS — I10 Essential (primary) hypertension: Secondary | ICD-10-CM | POA: Diagnosis not present

## 2017-07-24 DIAGNOSIS — K219 Gastro-esophageal reflux disease without esophagitis: Secondary | ICD-10-CM

## 2017-07-24 MED ORDER — EPINEPHRINE 0.3 MG/0.3ML IJ SOAJ: 0.3000 mg | Freq: Once | INTRAMUSCULAR | 0 refills | Status: AC

## 2017-07-24 MED ORDER — AMLODIPINE BESYLATE 10 MG PO TABS: 10.0000 mg | ORAL_TABLET | Freq: Every day | ORAL | 1 refills | Status: DC

## 2017-07-24 MED ORDER — AZILSARTAN-CHLORTHALIDONE 40-12.5 MG PO TABS: 1.0000 | ORAL_TABLET | Freq: Every day | ORAL | 1 refills | Status: DC

## 2017-07-24 MED ORDER — FENOFIBRATE 145 MG PO TABS: 145.0000 mg | ORAL_TABLET | Freq: Every day | ORAL | 1 refills | Status: DC

## 2017-07-24 MED ORDER — OMEPRAZOLE 20 MG PO CPDR: 20.0000 mg | DELAYED_RELEASE_CAPSULE | Freq: Every day | ORAL | 1 refills | Status: DC

## 2017-07-24 NOTE — Assessment & Plan Note (Signed)
Unable to tolerate pravastatin (muscle and joint aches). Current use of lovaza only.  Added fenofibrate. Lipid Panel     Component Value Date/Time   CHOL 262 (H) 06/21/2017 1005   TRIG 250.0 (H) 06/21/2017 1005   HDL 47.90 06/21/2017 1005   CHOLHDL 5 06/21/2017 1005   VLDL 50.0 (H) 06/21/2017 1005   LDLDIRECT 175.0 06/21/2017 1005

## 2017-07-24 NOTE — Assessment & Plan Note (Signed)
Improved BP with amlodipine and edarbychlor. Has made changes to diet (low sodium) and start walking daily.  BP Readings from Last 3 Encounters:  07/24/17 132/84  06/26/17 (!) 142/92  06/21/17 (!) 170/98

## 2017-07-24 NOTE — Progress Notes (Signed)
Subjective:  Patient ID: Patrick Levine, male    DOB: 1975/10/17  Age: 42 y.o. MRN: [id number]  CC: Follow-up (BP--stop pravastatin due hip pain.Epipen consult?req refill)  HPI  HTN: Improved BP with amlodipine and edarbychlor. BP Readings from Last 3 Encounters:  07/24/17 132/84  06/26/17 (!) 142/92  06/21/17 (!) 170/98   Hyperlipidemia: Unable to tolerate pravastatin (muscle and joint aches). Current use of lovaza only.  Lipid Panel     Component Value Date/Time   CHOL 262 (H) 06/21/2017 1005   TRIG 250.0 (H) 06/21/2017 1005   HDL 47.90 06/21/2017 1005   CHOLHDL 5 06/21/2017 1005   VLDL 50.0 (H) 06/21/2017 1005   LDLDIRECT 175.0 06/21/2017 1005   Outpatient Medications Prior to Visit  Medication Sig Dispense Refill  . Multiple Vitamin (ONE-A-DAY MENS PO) Take 1 tablet by mouth.      . omega-3 acid ethyl esters (LOVAZA) 1 g capsule Take 1 capsule (1 g total) by mouth 2 (two) times daily. 60 capsule 3  . amLODipine (NORVASC) 10 MG tablet Take 1 tablet (10 mg total) by mouth daily. 90 tablet 1  . Azilsartan-Chlorthalidone 40-12.5 MG TABS Take 1 tablet by mouth daily. 30 tablet 2  . omeprazole (PRILOSEC) 20 MG capsule Take 1 capsule (20 mg total) by mouth daily. 30 capsule 3  . fluticasone (FLONASE) 50 MCG/ACT nasal spray SPRAY 2 SPRAYS INTO EACH NOSTRIL EVERY DAY (Patient not taking: Reported on 07/24/2017) 16 g 0  . pravastatin (PRAVACHOL) 20 MG tablet Take 1 tablet (20 mg total) by mouth daily. (Patient not taking: Reported on 07/24/2017) 30 tablet 2  . sodium chloride (OCEAN) 0.65 % SOLN nasal spray Place 1 spray into both nostrils as needed for congestion. (Patient not taking: Reported on 07/24/2017) 15 mL 0  . sulfamethoxazole-trimethoprim (BACTRIM DS,SEPTRA DS) 800-160 MG tablet Take 1 tablet by mouth 2 (two) times daily. With food (Patient not taking: Reported on 07/24/2017) 14 tablet 0   No facility-administered medications prior to visit.     ROS See  HPI  Objective:  BP 132/84   Pulse 73   Temp 97.8 F (36.6 C) (Oral)   Ht 5\' 5"  (1.651 m)   Wt 252 lb (114.3 kg)   SpO2 97%   BMI 41.93 kg/m   BP Readings from Last 3 Encounters:  07/24/17 132/84  06/26/17 (!) 142/92  06/21/17 (!) 170/98    Wt Readings from Last 3 Encounters:  07/24/17 252 lb (114.3 kg)  06/26/17 254 lb 12.8 oz (115.6 kg)  06/21/17 256 lb (116.1 kg)    Physical Exam  Constitutional: He is oriented to person, place, and time. No distress.  Eyes: Pupils are equal, round, and reactive to light. Conjunctivae and EOM are normal.  Cardiovascular: Normal rate, regular rhythm and normal heart sounds.  Pulmonary/Chest: Effort normal and breath sounds normal.  Musculoskeletal: Normal range of motion. He exhibits tenderness.  Neurological: He is alert and oriented to person, place, and time.  Vitals reviewed.   Lab Results  Component Value Date   WBC 6.1 06/21/2017   HGB 14.8 06/21/2017   HCT 43.9 06/21/2017   PLT 314.0 06/21/2017   GLUCOSE 120 (H) 06/21/2017   CHOL 262 (H) 06/21/2017   TRIG 250.0 (H) 06/21/2017   HDL 47.90 06/21/2017   LDLDIRECT 175.0 06/21/2017   ALT 26 06/21/2017   AST 21 06/21/2017   NA 138 06/21/2017   K 4.0 06/21/2017   CL 100 06/21/2017   CREATININE 1.04 06/21/2017  BUN 17 06/21/2017   CO2 29 06/21/2017   TSH 1.04 06/21/2017   HGBA1C 5.9 05/19/2017    Ct Angio Head W/cm &/or Wo Cm  Result Date: 09/01/2015 CLINICAL DATA:  Severe RIGHT-sided headache beginning earlier today. EXAM: CT ANGIOGRAPHY HEAD AND NECK TECHNIQUE: Multidetector CT imaging of the head and neck was performed using the standard protocol during bolus administration of intravenous contrast. Multiplanar CT image reconstructions and MIPs were obtained to evaluate the vascular anatomy. Carotid stenosis measurements (when applicable) are obtained utilizing NASCET criteria, using the distal internal carotid diameter as the denominator. CONTRAST:  Isovue 370, 50 mL.  COMPARISON:  CT head earlier today. FINDINGS: Slight venous contamination due to bolus timing difficulty. Overall study diagnostic CT HEAD Calvarium and skull base: No fracture or destructive lesion. Mastoids and middle ears are grossly clear. Paranasal sinuses: Changes of chronic sinusitis similar to 2013, with BILATERAL maxillary sinus retention cysts. Chronic sclerotic change in the RIGHT sphenoid, under aerated. No fluid level. Orbits: Negative. Brain: No evidence of acute abnormality, including acute infarct, hemorrhage, hydrocephalus, or mass lesion. CTA NECK Aortic arch: Standard branching. Imaged portion shows no evidence of aneurysm or dissection. No significant stenosis of the major arch vessel origins. Right carotid system: No evidence of dissection, stenosis (50% or greater) or occlusion. Left carotid system: No evidence of dissection, stenosis (50% or greater) or occlusion. Vertebral arteries: LEFT vertebral dominant. RIGHT vertebral patent. No evidence of dissection, stenosis (50% or greater) or occlusion. Nonvascular soft tissues: Slight reversal of the normal cervical lordotic curve and cervical spine but no significant disc space narrowing. No neck masses. Lung apices clear. Trachea midline. CTA HEAD Anterior circulation: No significant stenosis, proximal occlusion, aneurysm, or vascular malformation. Posterior circulation: RIGHT vertebral is smaller but both contribute to basilar formation. No significant stenosis, proximal occlusion, aneurysm, or vascular malformation. Venous sinuses: As permitted by contrast timing, patent. Anatomic variants: None of significance. Delayed phase:   No abnormal intracranial enhancement. IMPRESSION: No extracranial or intracranial flow reducing lesion or dissection. No acute intracranial findings or postcontrast enhancement. Electronically Signed   By: Staci Righter M.D.   On: 09/01/2015 09:31   Ct Head Wo Contrast  Result Date: 09/01/2015 CLINICAL DATA:  Severe  right-sided headache onset this morning. EXAM: CT HEAD WITHOUT CONTRAST TECHNIQUE: Contiguous axial images were obtained from the base of the skull through the vertex without intravenous contrast. COMPARISON:  12/20/2011 FINDINGS: Skull and Sinuses:Negative for fracture or destructive process. Chronic sinusitis with mucous retention cyst filling much of the left maxillary antrum and moderate right mucosal thickening. No fluid level is seen. Chronic ground-glass sclerosis in place of the right sphenoid sinus. Visualized orbits: Negative. Brain: Unremarkable. No evidence of acute infarction, hemorrhage, hydrocephalus, or mass lesion/mass effect. IMPRESSION: 1. Negative intracranial imaging. 2. Chronic sinusitis that is similar to 2013 comparison. Electronically Signed   By: Monte Fantasia M.D.   On: 09/01/2015 05:22   Ct Angio Neck W/cm &/or Wo/cm  Result Date: 09/01/2015 CLINICAL DATA:  Severe RIGHT-sided headache beginning earlier today. EXAM: CT ANGIOGRAPHY HEAD AND NECK TECHNIQUE: Multidetector CT imaging of the head and neck was performed using the standard protocol during bolus administration of intravenous contrast. Multiplanar CT image reconstructions and MIPs were obtained to evaluate the vascular anatomy. Carotid stenosis measurements (when applicable) are obtained utilizing NASCET criteria, using the distal internal carotid diameter as the denominator. CONTRAST:  Isovue 370, 50 mL. COMPARISON:  CT head earlier today. FINDINGS: Slight venous contamination due to bolus timing  difficulty. Overall study diagnostic CT HEAD Calvarium and skull base: No fracture or destructive lesion. Mastoids and middle ears are grossly clear. Paranasal sinuses: Changes of chronic sinusitis similar to 2013, with BILATERAL maxillary sinus retention cysts. Chronic sclerotic change in the RIGHT sphenoid, under aerated. No fluid level. Orbits: Negative. Brain: No evidence of acute abnormality, including acute infarct, hemorrhage,  hydrocephalus, or mass lesion. CTA NECK Aortic arch: Standard branching. Imaged portion shows no evidence of aneurysm or dissection. No significant stenosis of the major arch vessel origins. Right carotid system: No evidence of dissection, stenosis (50% or greater) or occlusion. Left carotid system: No evidence of dissection, stenosis (50% or greater) or occlusion. Vertebral arteries: LEFT vertebral dominant. RIGHT vertebral patent. No evidence of dissection, stenosis (50% or greater) or occlusion. Nonvascular soft tissues: Slight reversal of the normal cervical lordotic curve and cervical spine but no significant disc space narrowing. No neck masses. Lung apices clear. Trachea midline. CTA HEAD Anterior circulation: No significant stenosis, proximal occlusion, aneurysm, or vascular malformation. Posterior circulation: RIGHT vertebral is smaller but both contribute to basilar formation. No significant stenosis, proximal occlusion, aneurysm, or vascular malformation. Venous sinuses: As permitted by contrast timing, patent. Anatomic variants: None of significance. Delayed phase:   No abnormal intracranial enhancement. IMPRESSION: No extracranial or intracranial flow reducing lesion or dissection. No acute intracranial findings or postcontrast enhancement. Electronically Signed   By: Staci Righter M.D.   On: 09/01/2015 09:31    Assessment & Plan:   Patrick Levine was seen today for follow-up.  Diagnoses and all orders for this visit:  Essential hypertension -     Azilsartan-Chlorthalidone 40-12.5 MG TABS; Take 1 tablet by mouth daily. -     amLODipine (NORVASC) 10 MG tablet; Take 1 tablet (10 mg total) by mouth daily.  Mixed hyperlipidemia -     fenofibrate (TRICOR) 145 MG tablet; Take 1 tablet (145 mg total) by mouth daily.  Bee sting allergy -     EPINEPHrine (EPIPEN 2-PAK) 0.3 mg/0.3 mL IJ SOAJ injection; Inject 0.3 mLs (0.3 mg total) into the muscle once for 1 dose.  Laryngitis -     omeprazole  (PRILOSEC) 20 MG capsule; Take 1 capsule (20 mg total) by mouth daily.  Gastroesophageal reflux disease, esophagitis presence not specified -     omeprazole (PRILOSEC) 20 MG capsule; Take 1 capsule (20 mg total) by mouth daily.   I have discontinued Aksel Mcginty's sodium chloride, fluticasone, pravastatin, and sulfamethoxazole-trimethoprim. I am also having him start on fenofibrate and EPINEPHrine. Additionally, I am having him maintain his Multiple Vitamin (ONE-A-DAY MENS PO), omega-3 acid ethyl esters, Azilsartan-Chlorthalidone, amLODipine, and omeprazole.  Meds ordered this encounter  Medications  . fenofibrate (TRICOR) 145 MG tablet    Sig: Take 1 tablet (145 mg total) by mouth daily.    Dispense:  90 tablet    Refill:  1    Order Specific Question:   Supervising Provider    Answer:   Lucille Passy [[id number]]  . EPINEPHrine (EPIPEN 2-PAK) 0.3 mg/0.3 mL IJ SOAJ injection    Sig: Inject 0.3 mLs (0.3 mg total) into the muscle once for 1 dose.    Dispense:  2 Device    Refill:  0    Order Specific Question:   Supervising Provider    Answer:   Lucille Passy [[id number]]  . Azilsartan-Chlorthalidone 40-12.5 MG TABS    Sig: Take 1 tablet by mouth daily.    Dispense:  90 tablet    Refill:  1    Order Specific Question:   Supervising Provider    Answer:   Lucille Passy [[id number]]  . amLODipine (NORVASC) 10 MG tablet    Sig: Take 1 tablet (10 mg total) by mouth daily.    Dispense:  90 tablet    Refill:  1    Order Specific Question:   Supervising Provider    Answer:   Lucille Passy [[id number]]  . omeprazole (PRILOSEC) 20 MG capsule    Sig: Take 1 capsule (20 mg total) by mouth daily.    Dispense:  90 capsule    Refill:  1    Order Specific Question:   Supervising Provider    Answer:   Lucille Passy [[id number]]    Follow-up: Return in about 3 months (around 10/23/2017) for HTN and , hyperlipidemia (fasting).  [name], NP

## 2017-07-24 NOTE — Patient Instructions (Addendum)
  Maintain current medications  Contact  [facility] for appt : [address] [place], [place]. [phone].  Upcoming appt for Sleep study with [facility] : [facility] [address], [place], [place] [id number]  Main: [phone]

## 2017-07-29 ENCOUNTER — Other Ambulatory Visit: Payer: Self-pay | Admitting: Nurse Practitioner

## 2017-07-29 DIAGNOSIS — J069 Acute upper respiratory infection, unspecified: Secondary | ICD-10-CM

## 2017-07-29 DIAGNOSIS — J040 Acute laryngitis: Secondary | ICD-10-CM

## 2017-08-08 ENCOUNTER — Encounter: Payer: Self-pay | Admitting: Neurology

## 2017-08-08 ENCOUNTER — Ambulatory Visit (INDEPENDENT_AMBULATORY_CARE_PROVIDER_SITE_OTHER): Payer: 59 | Admitting: Neurology

## 2017-08-08 VITALS — BP 148/89 | HR 72 | Ht 66.0 in | Wt 255.0 lb

## 2017-08-08 DIAGNOSIS — R351 Nocturia: Secondary | ICD-10-CM

## 2017-08-08 DIAGNOSIS — G4719 Other hypersomnia: Secondary | ICD-10-CM | POA: Diagnosis not present

## 2017-08-08 DIAGNOSIS — R0683 Snoring: Secondary | ICD-10-CM

## 2017-08-08 DIAGNOSIS — G2581 Restless legs syndrome: Secondary | ICD-10-CM

## 2017-08-08 DIAGNOSIS — R0681 Apnea, not elsewhere classified: Secondary | ICD-10-CM | POA: Diagnosis not present

## 2017-08-08 NOTE — Progress Notes (Signed)
Subjective:    Patient ID: Patrick Levine is a 42 y.o. male.  HPI     Patrick Age, MD, PhD [facility] [address] [place], [place] [address]  Dear Patrick Levine,   I saw your patient, Patrick Levine, upon your kind request, in my neurologic clinic today for initial consultation of his sleep disorder, in particular, concern for underlying obstructive sleep apnea. The patient is unaccompanied today. As you know, Patrick Levine, is a 42 year old right-handed gentleman with an underlying medical history of hypertension, hyperlipidemia, smoking, reflux disease and morbid obesity with a BMI of over 40, who reports snoring and excessive daytime somnolence. As well as witnessed apneic breathing pauses while asleep per wife's report. He has woken up with a sense of gasping for air when he sleeps on his back. I reviewed your office note from 06/21/2017. His Epworth sleepiness score is 19 out of 24, fatigue score is 10 out of 63. He is married and lives with his wife, son and grandson. He smokes one pack every 3 weeks or so, drinks alcohol in the form of beer, 12 pack per week on average. He works at [employer], has a Science writer for substance abuse and alcoholism. He is not aware of any family history of OSA. He is supposed to see ENT for his laryngitis/hoarseness.  He does endorse nocturia which is about twice per average night, denies morning headaches, endorses occasional restless leg symptoms. His bedtime is generally around 9:30 or 10, he is typically asleep by 10:30. Rise time is around 6. He has 4 children, 2 daughters and 2 sons, his youngest son Levine 42 lives with them and his 34-year-old grandson who is his daughter's son. His work schedule is typically from 11 AM to 7 PM. He does not work nights. His Past Medical History Is Significant For: Past Medical History:  Diagnosis Date  . Alcohol addiction ([place])   . Hypertension      His Past Surgical History Is Significant For: Past Surgical History:  Procedure Laterality Date  . LACERATION REPAIR  2001   lip laceration--stitches.    His Family History Is Significant For: Family History  Problem Relation Levine of Onset  . Hypertension Father   . Hypertension Paternal Grandfather     His Social History Is Significant For: Social History   Socioeconomic History  . Marital status: Married    Spouse name: Not on file  . Number of children: 4  . Years of education: Not on file  . Highest education level: Not on file  Occupational History  . Occupation: MENTAL HEALTH/CASE MANAGER    Employer: [employer]  . Financial resource strain: Not on file  . Food insecurity:    Worry: Not on file    Inability: Not on file  . Transportation needs:    Medical: Not on file    Non-medical: Not on file  Tobacco Use  . Smoking status: Current Some Day Smoker    Packs/day: 0.50    Types: Cigarettes  . Smokeless tobacco: Never Used  Substance and Sexual Activity  . Alcohol use: Yes    Alcohol/week: 3.6 oz    Types: 4 Cans of beer, 2 Shots of liquor per week    Comment: 6-8 drinks/day  . Drug use: No    Types: "Crack" cocaine    Comment: remote use of cocaine and marijuana  . Sexual activity: Yes  Lifestyle  .  Physical activity:    Days per week: Not on file    Minutes per session: Not on file  . Stress: Not on file  Relationships  . Social connections:    Talks on phone: Not on file    Gets together: Not on file    Attends religious service: Not on file    Active member of club or organization: Not on file    Attends meetings of clubs or organizations: Not on file    Relationship status: Not on file  Other Topics Concern  . 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   Married        His Allergies Are:  Allergies  Allergen Reactions  . Bee Venom Anaphylaxis  . Pravastatin Sodium Other (See Comments)    Muscle and joint aches  :   His Current Medications Are:  Outpatient Encounter Medications as of 08/08/2017  Medication Sig  . amLODipine (NORVASC) 10 MG tablet Take 1 tablet (10 mg total) by mouth daily.  . Azilsartan-Chlorthalidone 40-12.5 MG TABS Take 1 tablet by mouth daily.  . fenofibrate (TRICOR) 145 MG tablet Take 1 tablet (145 mg total) by mouth daily.  . fluticasone (FLONASE) 50 MCG/ACT nasal spray SPRAY 2 SPRAYS INTO EACH NOSTRIL EVERY DAY  . Multiple Vitamin (ONE-A-DAY MENS PO) Take 1 tablet by mouth.    . omega-3 acid ethyl esters (LOVAZA) 1 g capsule Take 1 capsule (1 g total) by mouth 2 (two) times daily.  Marland Kitchen omeprazole (PRILOSEC) 20 MG capsule Take 1 capsule (20 mg total) by mouth daily.   No facility-administered encounter medications on file as of 08/08/2017.   :  Review of Systems:  Out of a complete 14 point review of systems, all are reviewed and negative with the exception of these symptoms as listed below: Review of Systems  Neurological:       Pt presents today to discuss his sleep. Pt has never had a sleep study but does endorse snoring.  Epworth Sleepiness Scale 0= would never doze 1= slight chance of dozing 2= moderate chance of dozing 3= high chance of dozing  Sitting and reading: 3 Watching TV: 3 Sitting inactive in a public place (ex. Theater or meeting): 3 As a passenger in a car for an hour without a break: 3 Lying down to rest in the afternoon: 3 Sitting and talking to someone: 0 Sitting quietly after lunch (no alcohol): 3 In a car, while stopped in traffic: 1 Total: 19     Objective:  Neurological Exam  Physical Exam Physical Examination:   Vitals:   08/08/17 1107  BP: (!) 148/89  Pulse: 72    General Examination: The patient is a very pleasant 42 y.o. male in no acute distress. He appears well-developed  and well-nourished and well groomed.   HEENT: Normocephalic, atraumatic, pupils are equal, round and reactive to light and accommodation. Funduscopic exam is normal with sharp disc margins noted. Extraocular tracking is good without limitation to gaze excursion or nystagmus noted. Normal smooth pursuit is noted. Hearing is grossly intact. Tympanic membranes are clear bilaterally. Face is symmetric with normal facial animation and normal facial sensation. Speech is clear with no dysarthria noted. There is no hypophonia. There is no lip, neck/head, jaw or voice tremor. Neck is supple with full range of passive and active  motion. There are no carotid bruits on auscultation. Oropharynx exam reveals: mild mouth dryness, adequate dental hygiene and marked airway crowding, due to large uvula, tonsils are 3+, elongated tongue. Mallampati is class II, neck circumference is 18-3/8 inches, he has a minimal overbite. Tongue protrudes centrally and palate elevates symmetrically.   Chest: Clear to auscultation without wheezing, rhonchi or crackles noted.  Heart: S1+S2+0, regular and normal without murmurs, rubs or gallops noted.   Abdomen: Soft, non-tender and non-distended with normal bowel sounds appreciated on auscultation.  Extremities: There is no pitting edema in the distal lower extremities bilaterally. Pedal pulses are intact.  Skin: Warm and dry without trophic changes noted.  Musculoskeletal: exam reveals no obvious joint deformities, tenderness or joint swelling or erythema.   Neurologically:  Mental status: The patient is awake, alert and oriented in all 4 spheres. His immediate and remote memory, attention, language skills and fund of knowledge are appropriate. There is no evidence of aphasia, agnosia, apraxia or anomia. Speech is clear with normal prosody and enunciation. Thought process is linear. Mood is normal and affect is normal.  Cranial nerves II - XII are as described above under HEENT  exam. In addition: shoulder shrug is normal with equal shoulder height noted. Motor exam: Normal bulk, strength and tone is noted. There is no drift, tremor or rebound. Romberg is negative. Reflexes are 2+ throughout. Fine motor skills and coordination: intact with normal finger taps, normal hand movements, normal rapid alternating patting, normal foot taps and normal foot agility.  Cerebellar testing: No dysmetria or intention tremor on finger to nose testing. Heel to shin is unremarkable bilaterally. There is no truncal or gait ataxia.  Sensory exam: intact to light touch in the upper and lower extremities.  Gait, station and balance: He stands easily. No veering to one side is noted. No leaning to one side is noted. Posture is Levine-appropriate and stance is narrow based. Gait shows normal stride length and normal pace. No problems turning are noted. Tandem walk is unremarkable.   Assessment and Plan:  In summary, Iam Lipson is a very pleasant 42 y.o.-year old male with an underlying medical history of hypertension, hyperlipidemia, smoking, reflux disease and morbid obesity with a BMI of over 40, whose history and physical exam are concerning for obstructive sleep apnea (OSA). I had a long chat with the patient about my findings and the diagnosis of OSA, its prognosis and treatment options. We talked about medical treatments, surgical interventions and non-pharmacological approaches. I explained in particular the risks and ramifications of untreated moderate to severe OSA, especially with respect to developing cardiovascular disease down the Road, including congestive heart failure, difficult to treat hypertension, cardiac arrhythmias, or stroke. Even type 2 diabetes has, in part, been linked to untreated OSA. Symptoms of untreated OSA include daytime sleepiness, memory problems, mood irritability and mood disorder such as depression and anxiety, lack of energy, as well as recurrent headaches,  especially morning headaches. We talked about smoking cessation and trying to maintain a healthy lifestyle in general, as well as the importance of weight control. I encouraged the patient to eat healthy, exercise daily and keep well hydrated, to keep a scheduled bedtime and wake time routine, to not skip any meals and eat healthy snacks in between meals. I advised the patient not to drive when feeling sleepy. I recommended the following at this time: sleep study with potential positive airway pressure titration. (We will score hypopneas at 3%).   I explained the sleep  test procedure to the patient and also outlined possible surgical and non-surgical treatment options of OSA, including the use of a custom-made dental device (which would require a referral to a specialist dentist or oral surgeon), upper airway surgical options, such as pillar implants, radiofrequency surgery, tongue base surgery, and UPPP (which would involve a referral to an ENT surgeon). Rarely, jaw surgery such as mandibular advancement may be considered.  I also explained the CPAP treatment option to the patient, who indicated that he would be willing to try CPAP if the need arises. I explained the importance of being compliant with PAP treatment, not only for insurance purposes but primarily to improve His symptoms, and for the patient's long term health benefit, including to reduce His cardiovascular risks. I answered all his questions today and the patient was in agreement. I would like to see him back after the sleep study is completed and encouraged him to call with any interim questions, concerns, problems or updates.   Thank you very much for allowing me to participate in the care of this nice patient. If I can be of any further assistance to you please do not hesitate to call me at [phone].  Sincerely,   Patrick Age, MD, PhD

## 2017-08-08 NOTE — Patient Instructions (Signed)
Thank you for choosing [facility] for your sleep related care! It was nice to meet you today! I appreciate that you entrust me with your sleep related healthcare concerns. I hope, I was able to address at least some of your concerns today, and that I can help you feel reassured and also get better.    Here is what we discussed today and what we came up with as our plan for you:    Based on your symptoms and your exam I believe you are at risk for obstructive sleep apnea (aka OSA), and I think we should proceed with a sleep study to determine whether you do or do not have OSA and how severe it is. Even, if you have mild OSA, I may want you to consider treatment with CPAP, as treatment of even borderline or mild sleep apnea can result and improvement of symptoms such as sleep disruption, daytime sleepiness, nighttime bathroom breaks, restless leg symptoms, improvement of headache syndromes, even improved mood disorder.   Please remember, the long-term risks and ramifications of untreated moderate to severe obstructive sleep apnea are: increased Cardiovascular disease, including congestive heart failure, stroke, difficult to control hypertension, treatment resistant obesity, arrhythmias, especially irregular heartbeat commonly known as A. Fib. (atrial fibrillation); even type 2 diabetes has been linked to untreated OSA.   Sleep apnea can cause disruption of sleep and sleep deprivation in most cases, which, in turn, can cause recurrent headaches, problems with memory, mood, concentration, focus, and vigilance. Most people with untreated sleep apnea report excessive daytime sleepiness, which can affect their ability to drive. Please do not drive if you feel sleepy. Patients with sleep apnea developed difficulty initiating and maintaining sleep (aka insomnia).   Having sleep apnea may increase your risk for other sleep disorders, including involuntary behaviors sleep such as sleep terrors,  sleep talking, sleepwalking.    Having sleep apnea can also increase your risk for restless leg syndrome and leg movements at night.   Please note that untreated obstructive sleep apnea may carry additional perioperative morbidity. Patients with significant obstructive sleep apnea (typically, in the moderate to severe degree) should receive, if possible, perioperative PAP (positive airway pressure) therapy and the surgeons and particularly the anesthesiologists should be informed of the diagnosis and the severity of the sleep disordered breathing.   I will likely see you back after your sleep study to go over the test results and where to go from there. We will call you after your sleep study to advise about the results (most likely, you will hear from [place], my nurse) and to set up an appointment at the time, as necessary.    Our sleep lab administrative assistant will call you to schedule your sleep study and give you further instructions, regarding chicken process with a sleep study, arrival time, what to bring, when you can expect to leave after the study, and to answer any other logistical questions you may have. If you don't hear back from her by about 2 weeks from now, please feel free to call her direct line at [phone] or you can call our general clinic number, or email [place] through My Chart.

## 2017-08-17 MED FILL — EPINEPHRINE 0.3 MG AUTO-INJ: 0.3 | 30 days supply | Qty: 2 | Fill #0

## 2017-08-23 MED FILL — AMLODIPINE BESYLATE 10 MG T: 10 | 90 days supply | Qty: 90 | Fill #0

## 2017-08-23 MED FILL — OMEPRAZOLE 20 MG CAP: 20 | 90 days supply | Qty: 90 | Fill #0

## 2017-09-01 ENCOUNTER — Other Ambulatory Visit: Payer: Self-pay | Admitting: Nurse Practitioner

## 2017-09-01 DIAGNOSIS — I10 Essential (primary) hypertension: Secondary | ICD-10-CM

## 2017-09-11 ENCOUNTER — Emergency Department (HOSPITAL_BASED_OUTPATIENT_CLINIC_OR_DEPARTMENT_OTHER)
Admission: EM | Admit: 2017-09-11 | Discharge: 2017-09-11 | Disposition: A | Discharge status: Home / Self Care | Payer: Managed Care, Other (non HMO) | Attending: Emergency Medicine | Admitting: Emergency Medicine

## 2017-09-11 ENCOUNTER — Emergency Department (HOSPITAL_BASED_OUTPATIENT_CLINIC_OR_DEPARTMENT_OTHER): Payer: Managed Care, Other (non HMO)

## 2017-09-11 ENCOUNTER — Encounter (HOSPITAL_BASED_OUTPATIENT_CLINIC_OR_DEPARTMENT_OTHER): Payer: Self-pay | Admitting: Emergency Medicine

## 2017-09-11 ENCOUNTER — Ambulatory Visit: Payer: Self-pay

## 2017-09-11 ENCOUNTER — Other Ambulatory Visit: Payer: Self-pay

## 2017-09-11 DIAGNOSIS — L03119 Cellulitis of unspecified part of limb: Secondary | ICD-10-CM | POA: Diagnosis not present

## 2017-09-11 DIAGNOSIS — Z79899 Other long term (current) drug therapy: Secondary | ICD-10-CM | POA: Insufficient documentation

## 2017-09-11 DIAGNOSIS — R079 Chest pain, unspecified: Secondary | ICD-10-CM | POA: Diagnosis not present

## 2017-09-11 DIAGNOSIS — L03116 Cellulitis of left lower limb: Secondary | ICD-10-CM | POA: Diagnosis not present

## 2017-09-11 DIAGNOSIS — F17210 Nicotine dependence, cigarettes, uncomplicated: Secondary | ICD-10-CM | POA: Diagnosis not present

## 2017-09-11 DIAGNOSIS — R600 Localized edema: Secondary | ICD-10-CM | POA: Diagnosis not present

## 2017-09-11 DIAGNOSIS — R0602 Shortness of breath: Secondary | ICD-10-CM | POA: Diagnosis not present

## 2017-09-11 DIAGNOSIS — I10 Essential (primary) hypertension: Secondary | ICD-10-CM | POA: Diagnosis not present

## 2017-09-11 DIAGNOSIS — L03115 Cellulitis of right lower limb: Secondary | ICD-10-CM | POA: Diagnosis not present

## 2017-09-11 DIAGNOSIS — R0789 Other chest pain: Secondary | ICD-10-CM | POA: Insufficient documentation

## 2017-09-11 HISTORY — DX: Pure hypercholesterolemia, unspecified: E78.00

## 2017-09-11 LAB — CBC
HCT: 35.9 % — ABNORMAL LOW (ref 39.0–52.0)
Hemoglobin: 12.4 g/dL — ABNORMAL LOW (ref 13.0–17.0)
MCH: 29.7 pg (ref 26.0–34.0)
MCHC: 34.5 g/dL (ref 30.0–36.0)
MCV: 86.1 fL (ref 78.0–100.0)
PLATELETS: 209 10*3/uL (ref 150–400)
RBC: 4.17 MIL/uL — ABNORMAL LOW (ref 4.22–5.81)
RDW: 13 % (ref 11.5–15.5)
WBC: 5.5 10*3/uL (ref 4.0–10.5)

## 2017-09-11 LAB — URINALYSIS, ROUTINE W REFLEX MICROSCOPIC
BILIRUBIN URINE: NEGATIVE
Glucose, UA: NEGATIVE mg/dL
Ketones, ur: NEGATIVE mg/dL
LEUKOCYTES UA: NEGATIVE
NITRITE: NEGATIVE
PH: 6.5 (ref 5.0–8.0)
Protein, ur: NEGATIVE mg/dL
Specific Gravity, Urine: 1.01 (ref 1.005–1.030)

## 2017-09-11 LAB — URINALYSIS, MICROSCOPIC (REFLEX)

## 2017-09-11 LAB — HEPATIC FUNCTION PANEL
ALT: 44 U/L (ref 17–63)
AST: 33 U/L (ref 15–41)
Albumin: 3.7 g/dL (ref 3.5–5.0)
Alkaline Phosphatase: 65 U/L (ref 38–126)
BILIRUBIN DIRECT: 0.1 mg/dL (ref 0.1–0.5)
BILIRUBIN INDIRECT: 0.2 mg/dL — AB (ref 0.3–0.9)
BILIRUBIN TOTAL: 0.3 mg/dL (ref 0.3–1.2)
Total Protein: 7.3 g/dL (ref 6.5–8.1)

## 2017-09-11 LAB — BASIC METABOLIC PANEL
ANION GAP: 9 (ref 5–15)
BUN: 12 mg/dL (ref 6–20)
CALCIUM: 8.9 mg/dL (ref 8.9–10.3)
CO2: 22 mmol/L (ref 22–32)
CREATININE: 1.13 mg/dL (ref 0.61–1.24)
Chloride: 107 mmol/L (ref 101–111)
GFR calc Af Amer: >60 mL/min (ref >60)
GLUCOSE: 117 mg/dL — AB (ref 65–99)
Potassium: 4 mmol/L (ref 3.5–5.1)
Sodium: 138 mmol/L (ref 135–145)

## 2017-09-11 LAB — D-DIMER, QUANTITATIVE (NOT AT ARMC): D DIMER QUANT: 1.5 ug{FEU}/mL — AB (ref 0.00–0.50)

## 2017-09-11 LAB — TROPONIN I
TROPONIN I: 0.03 ng/mL — AB (ref <0.03)
Troponin I: <0.03 ng/mL (ref <0.03)

## 2017-09-11 LAB — CK: CK TOTAL: 216 U/L (ref 49–397)

## 2017-09-11 MED ORDER — CEPHALEXIN 500 MG PO CAPS: 500.0000 mg | ORAL_CAPSULE | Freq: Four times a day (QID) | ORAL | 0 refills | Status: DC

## 2017-09-11 MED ORDER — SODIUM CHLORIDE 0.9 % IV BOLUS
1000.0000 mL | Freq: Once | INTRAVENOUS | Status: AC
  Administered 2017-09-11: 1000 mL via INTRAVENOUS

## 2017-09-11 MED ORDER — MORPHINE SULFATE (PF) 4 MG/ML IV SOLN
4.0000 mg | Freq: Once | INTRAVENOUS | Status: DC
  Filled 2017-09-11: qty 1

## 2017-09-11 MED ORDER — METHOCARBAMOL 500 MG PO TABS: 500.0000 mg | ORAL_TABLET | Freq: Two times a day (BID) | ORAL | 0 refills | Status: DC

## 2017-09-11 MED ORDER — ACETAMINOPHEN 325 MG PO TABS
650.0000 mg | ORAL_TABLET | Freq: Once | ORAL | Status: AC | PRN
  Administered 2017-09-11: 650 mg via ORAL
  Filled 2017-09-11: qty 2

## 2017-09-11 MED ORDER — CLINDAMYCIN PHOSPHATE 600 MG/50ML IV SOLN
600.0000 mg | Freq: Once | INTRAVENOUS | Status: AC
  Administered 2017-09-11: 600 mg via INTRAVENOUS

## 2017-09-11 MED ORDER — IOPAMIDOL (ISOVUE-370) INJECTION 76%
100.0000 mL | Freq: Once | INTRAVENOUS | Status: AC | PRN
  Administered 2017-09-11: 100 mL via INTRAVENOUS

## 2017-09-11 MED FILL — METHOCARBAMOL 500 MG TABLET: 500 | 10 days supply | Qty: 20 | Fill #0

## 2017-09-11 MED FILL — CEPHALEXIN 500 MG CAPSULE: 500 | 7 days supply | Qty: 28 | Fill #0

## 2017-09-11 NOTE — ED Triage Notes (Addendum)
L side chest pain radiating to his back x 3 days with mild SOB. Pt also reports bilateral leg swelling and dark urine. Pt with recent car trip to [place] and back last week and is a smoker.

## 2017-09-11 NOTE — ED Notes (Signed)
Pt noted to be febrile, denies N/v/D, cough, sore throat.

## 2017-09-11 NOTE — ED Notes (Signed)
Date and time results received: 09/11/17 1005   Test: trp Critical Value: 0.03  Name of Provider Notified: Carlota Raspberry, PA  Orders Received? Or Actions Taken?: no orders given

## 2017-09-11 NOTE — Discharge Instructions (Signed)
You can take Tylenol or Ibuprofen as directed for pain. You can alternate Tylenol and Ibuprofen every 4 hours. If you take Tylenol at 1pm, then you can take Ibuprofen at 5pm. Then you can take Tylenol again at 9pm.   Take Robaxin as prescribed. This medication will make you drowsy so do not drive or drink alcohol when taking it.  Take antibiotics as directed. Please take all of your antibiotics until finished.  As we discussed, cardiology will arrange for an appointment for further testing.  If you have not heard from them, call their office and arrange for an appointment.  Return to the Emergency Department immediately if you experiencing worsening chest pain, difficulty breathing, nausea/vomiting, get very sweaty, headache or any other worsening or concerning symptoms.

## 2017-09-11 NOTE — ED Provider Notes (Signed)
[facility] DEPART[facility] Provider Note   CSN: [id number] Arrival date & time: 09/11/17  0901     History   Chief Complaint Chief Complaint  Patient presents with  . Chest Pain    HPI Patrick Levine is a 42 y.o. male possible history of hypertension, hyper lipidemia who presents for evaluation of chest pain, difficulty breathing, bilateral lower extremity swelling, dark urine, generalized fatigue that is been ongoing for last 3 days.  Patient reports he does not know what he was doing when the chest pain first started.  He states that he noticed it and then "it was just always kind of there as a dull ache."  He states the pain was not worse with deep inspiration but did notice some worsening with exertion.  He states that at one point he did get nauseous and diaphoretic with the pain.  He has not taken any medications for the pain.  He states that the pain will just kind of get better on its own.  He does state that he will routinely work out with lifting weights but denies any new weightlifting activity or new exercises.  Patient also reports some shortness of breath that is progressively worsened over the last 48 hours.  Patient states that he was at [facility] yesterday and noticed that he walked from the front of the sort of back to the store had a stop and catch his breath which is abnormal for him.  Patient also reports that his bilateral lower extremities have had some swelling and redness noted to them over the last 2 days.  Patient reports he still able to ambulate but states that they just hurt.  Additionally, patient reports that he has noticed his urine is very dark.  Patient states he did not notice a fever at home.  He reports that he just recently traveled from [place] which is a 6-hour car ride.  Patient went down Friday and returned yesterday, totaling 12 hours in the car.  Patient reports he does smoke approximately 3 to 4 cigarettes a day.  Denies any recent  cocaine use.  Denies any personal cardiac history.  Denies any family cardiac history.  Patient denies any abdominal pain, vomiting, numbness/weakness.  The history is provided by the patient.    Past Medical History:  Diagnosis Date  . Alcohol addiction ([place])   . High cholesterol   . Hypertension     Patient Active Problem List   Diagnosis Date Noted  . Hyperlipidemia 06/22/2017  . Hyperglycemia 06/22/2017  . Laryngitis 05/19/2017  . Numbness in both hands 03/25/2015  . 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 Fracture of ribs, multiple, closed 12/26/2011  . HTN (hypertension) 12/26/2011    Past Surgical History:  Procedure Laterality Date  . LACERATION REPAIR  2001   lip laceration--stitches.        Home Medications    Prior to Admission medications   Medication Sig Start Date End Date Taking? Authorizing Provider  amLODipine (NORVASC) 10 MG tablet Take 1 tablet (10 mg total) by mouth daily. 07/24/17  Yes Nche, Charlene Brooke, NP  hydrochlorothiazide (MICROZIDE) 12.5 MG capsule Take 12.5 mg by mouth daily.   Yes [provider]  Azilsartan-Chlorthalidone 40-12.5 MG TABS Take 1 tablet by mouth daily. 07/24/17   Nche, Charlene Brooke, NP  cephALEXin (KEFLEX) 500 MG capsule Take 1 capsule (500 mg total) by mouth 4 (four) times daily. 09/11/17   Tyreonna Czaplicki,  Willaim Mode A, PA-C  fenofibrate (TRICOR) 145 MG tablet Take 1 tablet (145 mg total) by mouth daily. 07/24/17   Nche, Charlene Brooke, NP  fluticasone (FLONASE) 50 MCG/ACT nasal spray SPRAY 2 SPRAYS INTO EACH NOSTRIL EVERY DAY 07/30/17   Nche, Charlene Brooke, NP  methocarbamol (ROBAXIN) 500 MG tablet Take 1 tablet (500 mg total) by mouth 2 (two) times daily. 09/11/17   Volanda Napoleon, PA-C  Multiple Vitamin (ONE-A-DAY MENS PO) Take 1 tablet by mouth.      [provider]  omega-3 acid ethyl esters (LOVAZA) 1 g capsule Take 1 capsule (1 g total) by mouth 2 (two) times daily.  06/22/17   Nche, Charlene Brooke, NP  omeprazole (PRILOSEC) 20 MG capsule Take 1 capsule (20 mg total) by mouth daily. 07/24/17   Nche, Charlene Brooke, NP    Family History Family History  Problem Relation Age of Onset  . Hypertension Father   . Hypertension Paternal Grandfather     Social History Social History   Tobacco Use  . Smoking status: Current Some Day Smoker    Packs/day: 0.50    Types: Cigarettes  . Smokeless tobacco: Never Used  Substance Use Topics  . Alcohol use: Yes    Alcohol/week: 3.6 oz    Types: 4 Cans of beer, 2 Shots of liquor per week    Comment: 6-8 drinks/day  . Drug use: No    Types: "Crack" cocaine    Comment: remote use of cocaine and marijuana     Allergies   Bee venom and Pravastatin sodium   Review of Systems Review of Systems  Constitutional: Positive for diaphoresis. Negative for fever.  Respiratory: Positive for shortness of breath. Negative for cough.   Cardiovascular: Positive for chest pain and leg swelling.  Gastrointestinal: Positive for nausea. Negative for abdominal pain and vomiting.  Genitourinary: Negative for dysuria and hematuria.  Neurological: Negative for headaches.  All other systems reviewed and are negative.    Physical Exam Updated Vital Signs BP 131/84   Pulse 80   Temp 98.9 F (37.2 C) (Oral)   Resp 19   Ht 5\' 11"  (1.803 m)   Wt 116.1 kg (256 lb)   SpO2 99%   BMI 35.70 kg/m   Physical Exam  Constitutional: He is oriented to person, place, and time. He appears well-developed and well-nourished.  HENT:  Head: Normocephalic and atraumatic.  Mouth/Throat: Oropharynx is clear and moist and mucous membranes are normal.  Eyes: Pupils are equal, round, and reactive to light. Conjunctivae, EOM and lids are normal.  Neck: Full passive range of motion without pain.  Cardiovascular: Normal rate, regular rhythm, normal heart sounds and normal pulses. Exam reveals no gallop and no friction rub.  No murmur  heard. Pulses:      Dorsalis pedis pulses are 2+ on the right side, and 2+ on the left side.  Pulmonary/Chest: Effort normal and breath sounds normal.  Lungs clear to auscultation bilaterally.  Symmetric chest rise.  No wheezing, rales, rhonchi.  Abdominal: Soft. Normal appearance. There is no tenderness. There is no rigidity and no guarding.  Musculoskeletal: Normal range of motion.  Diffuse non-pitting edema noted to BLE with some overlying erythema. Mild tenderness noted to bilateral calves.   Neurological: He is alert and oriented to person, place, and time.  Sensation intact along major nerve distributions of BLE  Skin: Skin is warm and dry. Capillary refill takes less than 2 seconds.  Good distal cap refill. BLE is not  dusky in appearance or cool to touch.  Psychiatric: He has a normal mood and affect. His speech is normal.  Nursing note and vitals reviewed.    ED Treatments / Results  Labs (all labs ordered are listed, but only abnormal results are displayed) Labs Reviewed  BASIC METABOLIC PANEL - Abnormal; Notable for the following components:      Result Value   Glucose, Bld 117 (*)    All other components within normal limits  CBC - Abnormal; Notable for the following components:   RBC 4.17 (*)    Hemoglobin 12.4 (*)    HCT 35.9 (*)    All other components within normal limits  TROPONIN I - Abnormal; Notable for the following components:   Troponin I 0.03 (*)    All other components within normal limits  D-DIMER, QUANTITATIVE (NOT AT [facility]) - Abnormal; Notable for the following components:   D-Dimer, Quant 1.50 (*)    All other components within normal limits  HEPATIC FUNCTION PANEL - Abnormal; Notable for the following components:   Indirect Bilirubin 0.2 (*)    All other components within normal limits  URINALYSIS, ROUTINE W REFLEX MICROSCOPIC - Abnormal; Notable for the following components:   Hgb urine dipstick MODERATE (*)    All other components within normal  limits  URINALYSIS, MICROSCOPIC (REFLEX) - Abnormal; Notable for the following components:   Bacteria, UA RARE (*)    All other components within normal limits  CK  TROPONIN I  TROPONIN I    EKG EKG Interpretation  Date/Time:  Monday September 11 2017 09:10:56 EDT Ventricular Rate:  85 PR Interval:    QRS Duration: 102 QT Interval:  346 QTC Calculation: 412 R Axis:   72 Text Interpretation:  Sinus rhythm Ventricular premature complex RSR' in V1 or V2, right VCD or RVH Minimal ST elevation, anterior leads since last tracing no significant change Confirmed by Malvin Johns [phone]) on 09/11/2017 12:35:43 PM   Radiology Dg Chest 2 View  Result Date: 09/11/2017 CLINICAL DATA:  Left-sided chest pain for 5-6 days EXAM: CHEST - 2 VIEW COMPARISON:  05/03/2016 FINDINGS: Normal heart size and mediastinal contours. No acute infiltrate or edema. No effusion or pneumothorax. No acute osseous findings. IMPRESSION: Negative chest. Electronically Signed   By: Monte Fantasia M.D.   On: 09/11/2017 09:31   Ct Angio Chest Pe W And/or Wo Contrast  Result Date: 09/11/2017 CLINICAL DATA:  Chest pain and shortness of breath EXAM: CT ANGIOGRAPHY CHEST WITH CONTRAST TECHNIQUE: Multidetector CT imaging of the chest was performed using the standard protocol during bolus administration of intravenous contrast. Multiplanar CT image reconstructions and MIPs were obtained to evaluate the vascular anatomy. CONTRAST:  153mL ISOVUE-370 IOPAMIDOL (ISOVUE-370) INJECTION 76% COMPARISON:  Chest radiograph December 20, 2011; chest radiograph September 11, 2017 FINDINGS: Cardiovascular: There is no demonstrable pulmonary embolus. There is no thoracic aortic aneurysm or dissection. Visualized great vessels appear unremarkable. No pericardial effusion or pericardial thickening evident. Mediastinum/Nodes: Thyroid appears unremarkable. There is no appreciable thoracic adenopathy. No esophageal lesions are appreciable. Lungs/Pleura: Lungs  are clear. No pleural effusion or pleural thickening evident. Upper Abdomen: Visualized upper abdominal structures appear unremarkable. Musculoskeletal: There are no blastic or lytic bone lesions. No evident chest wall lesion. Review of the MIP images confirms the above findings. IMPRESSION: 1. No demonstrable pulmonary embolus. No thoracic aortic aneurysm or dissection. 2.  Lungs clear. 3.  No demonstrable thoracic adenopathy. Electronically Signed   By: Lowella Grip III M.D.  On: 09/11/2017 10:49   US Venous Img Lower Bilateral  Result Date: 09/11/2017 CLINICAL DATA:  Bilateral lower extremity pain and edema since Friday. Recent car trip. Evaluate for DVT. EXAM: BILATERAL LOWER EXTREMITY VENOUS DOPPLER ULTRASOUND TECHNIQUE: Gray-scale sonography with graded compression, as well as color Doppler and duplex ultrasound were performed to evaluate the lower extremity deep venous systems from the level of the common femoral vein and including the common femoral, femoral, profunda femoral, popliteal and calf veins including the posterior tibial, peroneal and gastrocnemius veins when visible. The superficial great saphenous vein was also interrogated. Spectral Doppler was utilized to evaluate flow at rest and with distal augmentation maneuvers in the common femoral, femoral and popliteal veins. COMPARISON:  None. FINDINGS: RIGHT LOWER EXTREMITY Common Femoral Vein: No evidence of thrombus. Normal compressibility, respiratory phasicity and response to augmentation. Saphenofemoral Junction: No evidence of thrombus. Normal compressibility and flow on color Doppler imaging. Profunda Femoral Vein: No evidence of thrombus. Normal compressibility and flow on color Doppler imaging. Femoral Vein: No evidence of thrombus. Normal compressibility, respiratory phasicity and response to augmentation. Popliteal Vein: No evidence of thrombus. Normal compressibility, respiratory phasicity and response to augmentation. Calf  Veins: No evidence of thrombus. Normal compressibility and flow on color Doppler imaging. Superficial Great Saphenous Vein: No evidence of thrombus. Normal compressibility. Venous Reflux:  None. Other Findings:  None. LEFT LOWER EXTREMITY Common Femoral Vein: No evidence of thrombus. Normal compressibility, respiratory phasicity and response to augmentation. Saphenofemoral Junction: No evidence of thrombus. Normal compressibility and flow on color Doppler imaging. Profunda Femoral Vein: No evidence of thrombus. Normal compressibility and flow on color Doppler imaging. Femoral Vein: No evidence of thrombus. Normal compressibility, respiratory phasicity and response to augmentation. Popliteal Vein: No evidence of thrombus. Normal compressibility, respiratory phasicity and response to augmentation. Calf Veins: No evidence of thrombus. Normal compressibility and flow on color Doppler imaging. Superficial Great Saphenous Vein: No evidence of thrombus. Normal compressibility. Venous Reflux:  None. Other Findings:  None. IMPRESSION: No evidence of DVT within either lower extremity. Electronically Signed   By: Sandi Mariscal M.D.   On: 09/11/2017 11:42    Procedures Procedures (including critical care time)  Medications Ordered in ED Medications  acetaminophen (TYLENOL) tablet 650 mg (650 mg Oral Given 09/11/17 0924)  sodium chloride 0.9 % bolus 1,000 mL (0 mLs Intravenous Stopped 09/11/17 1327)  iopamidol (ISOVUE-370) 76 % injection 100 mL (100 mLs Intravenous Contrast Given 09/11/17 1030)  clindamycin (CLEOCIN) IVPB 600 mg (0 mg Intravenous Stopped 09/11/17 1431)     Initial Impression / Assessment and Plan / ED Course  I have reviewed the triage vital signs and the nursing notes.  Pertinent labs & imaging results that were available during my care of the patient were reviewed by me and considered in my medical decision making (see chart for details).     42 year old male with past Wednesday for  hypertension, hyperlipidemia who presents for evaluation of chest pain, shortness of breath, bilateral lower extremity edema, dark urine for last 3 days.  Recent travel to [place] prior to onset of symptoms.  No personal cardiac history.  On initial ED arrival, patient is febrile, hypertensive.  Vital signs otherwise stable.  On exam, lungs clear to auscultation bilaterally.  Does exhibit some nonpitting edema noted bilateral lower extremities with overlying erythema.  He does have bilateral calf tenderness to palpation.  Consider ACS etiology versus infectious etiology.  Also concern for rhabdomyolysis.  Also given recent travel, concern for DVT and  PE.  Initial labs ordered at triage.  Troponin is 0.03.  D-dimer is 1.50.  CBC shows no leukocytosis.  Hemoglobin is 12.4 hematocrit is 35.9.  BMP shows slight hyperglycemia.  Otherwise unremarkable.  Chest x-ray negative for any acute infectious etiology.  Given elevated d-dimer, will proceed with CTA of chest for evaluation of PE.  Additionally, given swelling of legs, will plan for ultrasound evaluation of DVTs.  UA shows moderate hemoglobin with red blood cells present.  CK is 216.  LFTs are unremarkable.  Troponin is at 0.03.   Ultrasound of bilateral lower extremities are negative for DVTs.  CTA of chest shows no evidence of PE.  Given patient's presentation, risk factors, he has a heart score of 4.  Discussed patient with Dr. Lorin Mercy (Hospitalist).  Given presentation, feels that this is more viral process in nature rather than cardiac in nature.  I emphasized my concerns about patient's risk factors, including hypertension, hyperlipidemia and smoking but given his presentation, she calculated his GRACE score and feels that patient is low risk for cardiac etiology.  At this time she does not feel the patient needs admission for cardiac rule out.  Recommends that if there is concern, call and discuss with cardiology.  If Cardiologist recommends admission,  will plan for hospital for admission.  Discussed patient with Dr. Ellyn Hack (Cardiology).  He reviewed patient's work-up and EKG.  He agrees that EKG appears to be nonspecific.  Given that patient has fever, question if this is true cardiac nature.  Additionally, patient said pain has been ongoing for the last 3 days which is less concerning for cardiac ab normalities.  Does agree the patient has risk factors would likely benefit from stress test but if patient has another negative delta troponin here in the ED, feel that patient can have outpatient stress test and follow-up with the cardiology office.  Updated patient and family on plan.  Patient states he is not currently having any pain.  We will plan for repeat troponin.  Repeat troponin negative.  Vital signs are stable.  We will plan to send patient home on antibiotic for possible cellulitis of bilateral lower extremities.  Additionally, encourage patient to follow-up with cardiology.  They will arrange for an outpatient stress test for further evaluation. Patient had ample opportunity for questions and discussion. All patient's questions were answered with full understanding. Strict return precautions discussed. Patient expresses understanding and agreement to plan.   Final Clinical Impressions(s) / ED Diagnoses   Final diagnoses:  Atypical chest pain  Cellulitis of lower extremity, unspecified laterality    ED Discharge Orders        Ordered    cephALEXin (KEFLEX) 500 MG capsule  4 times daily     09/11/17 1647    methocarbamol (ROBAXIN) 500 MG tablet  2 times daily     09/11/17 1647       Volanda Napoleon, PA-C 09/12/17 1723    Malvin Johns, MD 09/13/17 [phone]

## 2017-09-11 NOTE — Progress Notes (Signed)
Patient is a 42yo with 3 days of chest pain, "possible cellulitis."  Multiple complaints on presentation.  Left-sided CP, worse with exertion, +SOB.  Also with B LE edema and redness. Hasn't really felt like himself.  +long car trips.  +tobacco.  No cocaine.  -FH.  +HLD, not taking statin.  No fevers at home, but he was 101.4 in the ER.  Initial work-up with troponin 0.03, repeat negative.  CBC ok. CMP fine.  CK 216, normal.  UA not infectious.  Elevated D-dimer, negative for PE.  Negative DVT US.  CXR negative. EKG with diffuse ST changes, no STEMI.     Based on review of the chart and discussion with PA [place], the patient does not appear to need inpatient evaluation (including observation).  With 3 days of chest pain and a negative troponin/EKG, he should be appropriate for outpatient f/u.  If the PA/EDP feel strongly, I would encourage them to consult cardiology to determine if they think admission is necessary and if they do then [facility] will be happy to observe him overnight.  Additionally, B LE cellulitis is extremely uncommon and since he has not tried outpatient treatment for this issue, this would be an appropriate treatment plan.  However, this seems more likely a viral symptom complex and outpatient PCP f/u may be sufficient.    The transfer is deferred at this time.  Carlyon Shadow, M.D.

## 2017-09-11 NOTE — Telephone Encounter (Signed)
Pt. Reports he "has felt bad all over for 2 days." C/o intermittent chest pain. Both legs are swollen up to the knee with "red blotches." "Hurts all over.My hands get numb feeling sometimes and I feel off-balance." Reports "my urine is very dark too." Instructed pt. To go to ED for evaluation due to multiple symptoms and chest pain. Verbalizes understanding. Reason for Disposition . Patient sounds very sick or weak to the triager  Answer Assessment - Initial Assessment Questions 1. LOCATION: "Where does it hurt?"       Middle and to the left 2. RADIATION: "Does the pain go anywhere else?" (e.g., into neck, jaw, arms, back)     Fingers feel numb 3. ONSET: "When did the chest pain begin?" (Minutes, hours or days)      2 days ago 4. PATTERN "Does the pain come and go, or has it been constant since it started?"  "Does it get worse with exertion?"      Comes and goes 5. DURATION: "How long does it last" (e.g., seconds, minutes, hours)     Lasts a few seconds 6. SEVERITY: "How bad is the pain?"  (e.g., Scale 1-10; mild, moderate, or severe)    - MILD (1-3): doesn't interfere with normal activities     - MODERATE (4-7): interferes with normal activities or awakens from sleep    - SEVERE (8-10): excruciating pain, unable to do any normal activities       4-5 7. CARDIAC RISK FACTORS: "Do you have any history of heart problems or risk factors for heart disease?" (e.g., prior heart attack, angina; high blood pressure, diabetes, being overweight, high cholesterol, smoking, or strong family history of heart disease)     HTN, high cholesterol 8. PULMONARY RISK FACTORS: "Do you have any history of lung disease?"  (e.g., blood clots in lung, asthma, emphysema, birth control pills)     No 9. CAUSE: "What do you think is causing the chest pain?"     Unsure 10. OTHER SYMPTOMS: "Do you have any other symptoms?" (e.g., dizziness, nausea, vomiting, sweating, fever, difficulty breathing, cough)       Leg edema  bilateral 11. PREGNANCY: "Is there any chance you are pregnant?" "When was your last menstrual period?"       n/a  Protocols used: CHEST PAIN-A-AH

## 2017-09-12 ENCOUNTER — Telehealth: Payer: Self-pay

## 2017-09-12 DIAGNOSIS — R0681 Apnea, not elsewhere classified: Secondary | ICD-10-CM

## 2017-09-12 NOTE — Telephone Encounter (Signed)
VO for HST from Dr. Athar received. HST order placed.  

## 2017-09-12 NOTE — Telephone Encounter (Signed)
[employer] denied in lab sleep study, need HST order

## 2017-09-13 ENCOUNTER — Encounter: Payer: Self-pay | Admitting: Cardiology

## 2017-09-13 ENCOUNTER — Ambulatory Visit (INDEPENDENT_AMBULATORY_CARE_PROVIDER_SITE_OTHER): Payer: Managed Care, Other (non HMO) | Admitting: Cardiology

## 2017-09-13 VITALS — BP 132/78 | HR 73 | Ht 71.0 in | Wt 252.0 lb

## 2017-09-13 DIAGNOSIS — R079 Chest pain, unspecified: Secondary | ICD-10-CM | POA: Diagnosis not present

## 2017-09-13 DIAGNOSIS — E782 Mixed hyperlipidemia: Secondary | ICD-10-CM | POA: Diagnosis not present

## 2017-09-13 DIAGNOSIS — I10 Essential (primary) hypertension: Secondary | ICD-10-CM | POA: Diagnosis not present

## 2017-09-13 DIAGNOSIS — F17210 Nicotine dependence, cigarettes, uncomplicated: Secondary | ICD-10-CM | POA: Insufficient documentation

## 2017-09-13 NOTE — Progress Notes (Signed)
Cardiology Office Note:    Date:  09/13/2017   ID:  Patrick Levine, DOB Feb 14, 1976, MRN [id number]  PCP:  Flossie Buffy, NP  Cardiologist:  Jenean Lindau, MD   Referring MD: Flossie Buffy, NP    ASSESSMENT:    1. Chest pain, unspecified type   2. Essential hypertension   3. Mixed dyslipidemia   4. Cigarette smoker    PLAN:    In order of problems listed above:  1. Primary prevention stressed with the patient.  Importance of compliance with diet and medication stressed and he vocalized understanding.  His blood pressure is stable.  I spent 5 minutes with the patient discussing solely about smoking. Smoking cessation was counseled. I suggested to the patient also different medications and pharmacological interventions. Patient is keen to try stopping on its own at this time. He will get back to me if he needs any further assistance in this matter. 2. In view of his chest pain I have asked him to take NSAIDs at this time as the pain is clinically appearing is musculoskeletal.  In view of risk factors in the next week or 2 I will schedule him for an exercise stress echo.  If he has any significant chest pain he knows to go to the nearest emergency room. 3. Discussed with dyslipidemia and patient has tried statin therapy in the past with joint pains.  I will assess this when he comes back to see me in a month and try to see if I can try him on multiple lipid-lowering medications as his lipids are extremely high.   Medication Adjustments/Labs and Tests Ordered: Current medicines are reviewed at length with the patient today.  Concerns regarding medicines are outlined above.  Orders Placed This Encounter  Procedures  . ECHOCARDIOGRAM STRESS TEST   No orders of the defined types were placed in this encounter.    History of Present Illness:    Patrick Levine is a 42 y.o. male who is being seen today for the evaluation of chest pain at the request of [place],  Charlene Brooke, NP.  The patient is a pleasant 42 year old male.  He has past medical history of essential hypertension, market dyslipidemia and cigarette smoking.  Is a very active gentleman.  He tells me that he is very active at the gym but without any symptoms.  He has chest pain he says which is why he went to the emergency room and was evaluated.  A CT scan of the chest was unremarkable and ruled out pulmonary embolism.  The patient mentions to me that this is a stab-like sensation in the chest with no radiation.  When I went into the room I wanted him to get his sock off so that I could check his peripheral pulses.  As soon as he leaned forward he had a pain in the chest very typical of musculoskeletal pain which got better.  Past Medical History:  Diagnosis Date  . Alcohol addiction ([place])   . High cholesterol   . Hypertension     Past Surgical History:  Procedure Laterality Date  . LACERATION REPAIR  2001   lip laceration--stitches.    Current Medications: Current Meds  Medication Sig  . amLODipine (NORVASC) 10 MG tablet Take 1 tablet (10 mg total) by mouth daily.  . Azilsartan-Chlorthalidone 40-12.5 MG TABS Take 1 tablet by mouth daily.  . cephALEXin (KEFLEX) 500 MG capsule Take 1 capsule (500 mg total) by mouth 4 (four) times daily.  Marland Kitchen  fenofibrate (TRICOR) 145 MG tablet Take 1 tablet (145 mg total) by mouth daily.  . hydrochlorothiazide (MICROZIDE) 12.5 MG capsule Take 12.5 mg by mouth daily.  . Multiple Vitamin (ONE-A-DAY MENS PO) Take 1 tablet by mouth.    . omega-3 acid ethyl esters (LOVAZA) 1 g capsule Take 1 capsule (1 g total) by mouth 2 (two) times daily.  Marland Kitchen omeprazole (PRILOSEC) 20 MG capsule Take 1 capsule (20 mg total) by mouth daily.  . [DISCONTINUED] pravastatin (PRAVACHOL) 20 MG tablet Take 20 mg by mouth daily.     Allergies:   Bee venom and Pravastatin sodium   Social History   Socioeconomic History  . Marital status: Married    Spouse name: Not on file  .  Number of children: 4  . Years of education: Not on file  . Highest education level: Not on file  Occupational History  . Occupation: MENTAL HEALTH/CASE MANAGER    Employer: [employer]  . Financial resource strain: Not on file  . Food insecurity:    Worry: Not on file    Inability: Not on file  . Transportation needs:    Medical: Not on file    Non-medical: Not on file  Tobacco Use  . Smoking status: Current Some Day Smoker    Packs/day: 0.50    Types: Cigarettes  . Smokeless tobacco: Never Used  Substance and Sexual Activity  . Alcohol use: Yes    Alcohol/week: 3.6 oz    Types: 4 Cans of beer, 2 Shots of liquor per week    Comment: 6-8 drinks/day  . Drug use: No    Types: "Crack" cocaine    Comment: remote use of cocaine and marijuana  . Sexual activity: Yes  Lifestyle  . Physical activity:    Days per week: Not on file    Minutes per session: Not on file  . Stress: Not on file  Relationships  . Social connections:    Talks on phone: Not on file    Gets together: Not on file    Attends religious service: Not on file    Active member of club or organization: Not on file    Attends meetings of clubs or organizations: Not on file    Relationship status: Not on file  Other Topics Concern  . 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   Married        Family History: The patient's family history includes Hypertension in his father and paternal grandfather.  ROS:   Please see the history of present illness.    All other systems reviewed and are negative.  EKGs/Labs/Other Studies Reviewed:    The following studies were reviewed today: EKG was reviewed.   Recent Labs: 06/21/2017: TSH 1.04 09/11/2017: ALT 44; BUN 12; Creatinine, Ser 1.13; Hemoglobin 12.4; Platelets 209; Potassium 4.0; Sodium 138    Recent Lipid Panel    Component Value Date/Time   CHOL 262 (H) 06/21/2017 1005   TRIG 250.0 (H) 06/21/2017 1005   HDL 47.90 06/21/2017 1005   CHOLHDL 5 06/21/2017 1005   VLDL 50.0 (H) 06/21/2017 1005   LDLDIRECT 175.0 06/21/2017 1005    Physical Exam:    VS:  BP 132/78 (BP Location: Left Arm, Patient Position: Sitting, Cuff Size: Normal)   Pulse 73   Ht  5\' 11"  (1.803 m)   Wt 252 lb (114.3 kg)   SpO2 98%   BMI 35.15 kg/m     Wt Readings from Last 3 Encounters:  09/13/17 252 lb (114.3 kg)  09/11/17 256 lb (116.1 kg)  08/08/17 255 lb (115.7 kg)     GEN: Patient is in no acute distress HEENT: Normal NECK: No JVD; No carotid bruits LYMPHATICS: No lymphadenopathy CARDIAC: S1 S2 regular, 2/6 systolic murmur at the apex. RESPIRATORY:  Clear to auscultation without rales, wheezing or rhonchi  ABDOMEN: Soft, non-tender, non-distended MUSCULOSKELETAL:  No edema; No deformity  SKIN: Warm and dry NEUROLOGIC:  Alert and oriented x 3 PSYCHIATRIC:  Normal affect    Signed, Jenean Lindau, MD  09/13/2017 10:08 AM    [place]

## 2017-09-13 NOTE — Patient Instructions (Signed)
Medication Instructions:  Your physician recommends that you continue on your current medications as directed. Please refer to the Current Medication list given to you today.  Labwork: None  Testing/Procedures: Your physician has requested that you have a stress echocardiogram. For further information please visit [email or web address]. Please follow instruction sheet as given.  Follow-Up: Your physician recommends that you schedule a follow-up appointment in: 1 month  Any Other Special Instructions Will Be Listed Below (If Applicable).     If you need a refill on your cardiac medications before your next appointment, please call your pharmacy.   [place], RN, BSN   Cardiopulmonary Exercise Stress Test Cardiopulmonary exercise testing (CPET) is a test that checks how your heart and lungs react to exercise. This is called your exercise capacity. During this test, you will walk or run on a treadmill or pedal on a stationary bike while tests are done on your heart and lungs. You may have this test to:  See why you are short of breath.  Check for exercise intolerance.  See how your lungs work.  See how your heart works.  Check for how you are responding to a heart or lung rehabilitation program.  See if you have a heart or lung problem.  See if you are healthy enough to have surgery.  What happens before the procedure?  Follow instructions from your doctor about what you cannot eat or drink.  Ask your doctor about changing or stopping your normal medicines. This is important if you take diabetes medicines or blood thinners.  Wear loose, comfortable clothing and shoes.  If you use an inhaler, bring it with you to the test. What happens during the procedure?  A blood pressure cuff will be placed on your arm.  Several stick-on patches (electrodes) will be placed on your chest. They will be attached to an electrocardiogram (EKG) machine.  A clip-on  monitor that measures the amount of oxygen in your blood will be placed on your finger (pulse oximeter).  A clip will be placed on your nose and a mouthpiece will be placed in your mouth. This may be held in place with a headpiece. You will breathe through the mouthpiece during the test.  You will be asked to start exercising. You will be closely watched while you exercise.  The amount of effort for your exercise will be gradually increased.  During exercise, the test will measure: ? Your heart rate. ? Your heart rhythm. ? Your oxygen blood level. ? The amount of oxygen and carbon dioxide that you breathe out.  The test will end when: ? You have finished the test. ? You have reached your maximum ability to exercise. ? You have chest or leg pain, dizziness, or shortness of breath. The procedure may vary among doctors and hospitals. What happens after the procedure?  Your blood pressure and EKG will be checked to watch how you recover from the test. This information is not intended to replace advice given to you by your health care provider. Make sure you discuss any questions you have with your health care provider. Document Released: 03/02/2009 Document Revised: 08/04/2015 Document Reviewed: 01/26/2015 Elsevier Interactive Patient Education  2018 [employer].

## 2017-09-21 ENCOUNTER — Telehealth: Payer: Self-pay

## 2017-09-21 ENCOUNTER — Ambulatory Visit (HOSPITAL_BASED_OUTPATIENT_CLINIC_OR_DEPARTMENT_OTHER): Payer: Managed Care, Other (non HMO)

## 2017-09-21 DIAGNOSIS — R079 Chest pain, unspecified: Secondary | ICD-10-CM

## 2017-09-21 DIAGNOSIS — I10 Essential (primary) hypertension: Secondary | ICD-10-CM

## 2017-09-21 NOTE — Telephone Encounter (Signed)
Informed the patient that his appointment had been cancelled for today on account of insurance not approving. Patrick Levine will be contacting the patient soon to schedule an appointment.

## 2017-09-21 NOTE — Telephone Encounter (Signed)
-----   Message from [facility] sent at 09/21/2017  9:03 AM EDT ----- Regarding: FW: Denied Stress Echo Scheduled for Today  Caryl Pina   Can someone call pt and let them know this is cancelled and schedule for GXT?  Thanks!  Charmaine ----- Message ----- From: Jenean Lindau, MD Sent: 09/21/2017   8:41 AM To: Lillia Pauls Subject: RE: Denied Stress Echo Scheduled for Today     gxt ----- Message ----- From: Lillia Pauls Sent: 09/21/2017   8:21 AM To: Lavell Luster, MD, # Subject: Denied Stress Echo Scheduled for Today         Dr. Geraldo Pitter  Stress echo for today denied by insurance.  Ok to change to GXT?  Denied d/t pt can walk on treadmill.  " 1) the ECG is uninterpretable for assessment of ischemia, or 2) the individual cannot adequately exercise on a treadmill. An exercise treadmill test (without imaging), may be indicated, and does not require prior authorization by [employer]. The clinical information provided does not meet criteria for a stress echocardiogram and, therefore, the request is not indicated at this time.    Thanks  Charmaine

## 2017-09-27 ENCOUNTER — Ambulatory Visit (INDEPENDENT_AMBULATORY_CARE_PROVIDER_SITE_OTHER): Payer: Managed Care, Other (non HMO)

## 2017-09-27 DIAGNOSIS — R079 Chest pain, unspecified: Secondary | ICD-10-CM | POA: Diagnosis not present

## 2017-09-27 DIAGNOSIS — I10 Essential (primary) hypertension: Secondary | ICD-10-CM | POA: Diagnosis not present

## 2017-09-27 LAB — EXERCISE TOLERANCE TEST
CHL CUP MPHR: 178 {beats}/min
CHL CUP RESTING HR STRESS: 78 {beats}/min
CHL RATE OF PERCEIVED EXERTION: 16
CSEPEDS: 47 s
Estimated workload: 10.1 METS
Exercise duration (min): 8 min
Peak HR: 155 {beats}/min
Percent HR: 87 %

## 2017-10-12 ENCOUNTER — Other Ambulatory Visit: Payer: Self-pay | Admitting: Nurse Practitioner

## 2017-10-12 DIAGNOSIS — E782 Mixed hyperlipidemia: Secondary | ICD-10-CM

## 2017-10-16 ENCOUNTER — Ambulatory Visit (INDEPENDENT_AMBULATORY_CARE_PROVIDER_SITE_OTHER): Payer: Managed Care, Other (non HMO) | Admitting: Neurology

## 2017-10-16 DIAGNOSIS — R0681 Apnea, not elsewhere classified: Secondary | ICD-10-CM

## 2017-10-16 DIAGNOSIS — G4733 Obstructive sleep apnea (adult) (pediatric): Secondary | ICD-10-CM | POA: Diagnosis not present

## 2017-10-18 ENCOUNTER — Ambulatory Visit (INDEPENDENT_AMBULATORY_CARE_PROVIDER_SITE_OTHER): Payer: Managed Care, Other (non HMO) | Admitting: Cardiology

## 2017-10-18 ENCOUNTER — Encounter: Payer: Self-pay | Admitting: Cardiology

## 2017-10-18 VITALS — BP 132/96 | HR 80 | Ht 71.0 in | Wt 251.0 lb

## 2017-10-18 DIAGNOSIS — E782 Mixed hyperlipidemia: Secondary | ICD-10-CM

## 2017-10-18 DIAGNOSIS — I10 Essential (primary) hypertension: Secondary | ICD-10-CM | POA: Diagnosis not present

## 2017-10-18 DIAGNOSIS — F17210 Nicotine dependence, cigarettes, uncomplicated: Secondary | ICD-10-CM | POA: Diagnosis not present

## 2017-10-18 MED ORDER — PITAVASTATIN CALCIUM 2 MG PO TABS: 0.5000 | ORAL_TABLET | Freq: Every day | ORAL | 3 refills | Status: DC

## 2017-10-18 NOTE — Addendum Note (Signed)
Addended by: Austin Miles on: 10/18/2017 12:19 PM   Modules accepted: Orders

## 2017-10-18 NOTE — Progress Notes (Signed)
Cardiology Office Note:    Date:  10/18/2017   ID:  Patrick Levine, DOB 1975-04-02, MRN [id number]  PCP:  Flossie Buffy, NP  Cardiologist:  Jenean Lindau, MD   Referring MD: Flossie Buffy, NP    ASSESSMENT:    1. Essential hypertension   2. Mixed dyslipidemia   3. Cigarette smoker    PLAN:    In order of problems listed above:  1. Primary prevention stressed with the patient.  Importance of compliance with diet and medications stressed and he vocalized understanding.  His blood pressure is a little elevated because his neighbor with whom he was very close passed away last night.  Otherwise he mentions to me that his blood pressure readings at home are fine. 2. Diet was discussed for dyslipidemia.  He recently had liver function test which were unremarkable.  I will initiate him on Livalo 2 mg half tablet daily.  Diet was discussed.  I encouraged him to take co-Q10 to see if this joint pains are any better with this medicine.  If it does not help and if he has significant side effects he will stop the medicine.  If he continues to medicines he will be coming back for blood work in a month.  Which is a liver lipid check. 3. I advised him to quit smoking and he plans to do so.Patient will be seen in follow-up appointment in 6 months or earlier if the patient has any concerns    Medication Adjustments/Labs and Tests Ordered: Current medicines are reviewed at length with the patient today.  Concerns regarding medicines are outlined above.  No orders of the defined types were placed in this encounter.  No orders of the defined types were placed in this encounter.    Chief Complaint  Patient presents with  . Follow-up     History of Present Illness:    Patrick Levine is a 42 y.o. male.  The patient was evaluated by me for chest tightness-like symptoms.  He has a history of essential hypertension and dyslipidemia.  Unfortunately continues to smoke.  He  denies any chest pain now.  He tells me that he is getting a better lifestyle.  He is trying to work on quitting smoking.  No chest pain orthopnea or PND.  At the time of my evaluation, the patient is alert awake oriented and in no distress.  Past Medical History:  Diagnosis Date  . Alcohol addiction ([place])   . High cholesterol   . Hypertension     Past Surgical History:  Procedure Laterality Date  . LACERATION REPAIR  2001   lip laceration--stitches.    Current Medications: Current Meds  Medication Sig  . amLODipine (NORVASC) 10 MG tablet Take 1 tablet (10 mg total) by mouth daily.  . Azilsartan-Chlorthalidone 40-12.5 MG TABS Take 1 tablet by mouth daily.  . cephALEXin (KEFLEX) 500 MG capsule Take 1 capsule (500 mg total) by mouth 4 (four) times daily.  . fenofibrate (TRICOR) 145 MG tablet Take 1 tablet (145 mg total) by mouth daily.  . hydrochlorothiazide (MICROZIDE) 12.5 MG capsule Take 12.5 mg by mouth daily.  . Multiple Vitamin (ONE-A-DAY MENS PO) Take 1 tablet by mouth.    . omega-3 acid ethyl esters (LOVAZA) 1 g capsule TAKE 1 CAPSULE BY MOUTH 2 TIMES DAILY  . omeprazole (PRILOSEC) 20 MG capsule Take 1 capsule (20 mg total) by mouth daily.     Allergies:   Bee venom and Pravastatin sodium  Social History   Socioeconomic History  . Marital status: Married    Spouse name: Not on file  . Number of children: 4  . Years of education: Not on file  . Highest education level: Not on file  Occupational History  . Occupation: MENTAL HEALTH/CASE MANAGER    Employer: [employer]  . Financial resource strain: Not on file  . Food insecurity:    Worry: Not on file    Inability: Not on file  . Transportation needs:    Medical: Not on file    Non-medical: Not on file  Tobacco Use  . Smoking status: Current Some Day Smoker    Packs/day: 0.50    Types: Cigarettes  . Smokeless tobacco: Never Used  Substance and Sexual Activity  . Alcohol use: Yes     Alcohol/week: 3.6 oz    Types: 4 Cans of beer, 2 Shots of liquor per week    Comment: 6-8 drinks/day  . Drug use: No    Types: "Crack" cocaine    Comment: remote use of cocaine and marijuana  . Sexual activity: Yes  Lifestyle  . Physical activity:    Days per week: Not on file    Minutes per session: Not on file  . Stress: Not on file  Relationships  . Social connections:    Talks on phone: Not on file    Gets together: Not on file    Attends religious service: Not on file    Active member of club or organization: Not on file    Attends meetings of clubs or organizations: Not on file    Relationship status: Not on file  Other Topics Concern  . 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   Married        Family History: The patient's family history includes Hypertension in his father and paternal grandfather.  ROS:   Please see the history of present illness.    All other systems reviewed and are negative.  EKGs/Labs/Other Studies Reviewed:    The following studies were reviewed today: Results of the stress test were discussed with the patient at extensive length and he vocalized understanding.   Recent Labs: 06/21/2017: TSH 1.04 09/11/2017: ALT 44; BUN 12; Creatinine, Ser 1.13; Hemoglobin 12.4; Platelets 209; Potassium 4.0; Sodium 138  Recent Lipid Panel    Component Value Date/Time   CHOL 262 (H) 06/21/2017 1005   TRIG 250.0 (H) 06/21/2017 1005   HDL 47.90 06/21/2017 1005   CHOLHDL 5 06/21/2017 1005   VLDL 50.0 (H) 06/21/2017 1005   LDLDIRECT 175.0 06/21/2017 1005    Physical Exam:    VS:  BP (!) 132/96 (BP Location: Left Arm, Patient Position: Sitting, Cuff Size: Normal)   Pulse 80   Ht 5\' 11"  (1.803 m)   Wt 251 lb (113.9 kg)   SpO2 98%   BMI 35.01 kg/m     Wt Readings from Last 3 Encounters:  10/18/17 251 lb  (113.9 kg)  09/13/17 252 lb (114.3 kg)  09/11/17 256 lb (116.1 kg)     GEN: Patient is in no acute distress HEENT: Normal NECK: No JVD; No carotid bruits LYMPHATICS: No lymphadenopathy CARDIAC: Hear sounds regular, 2/6 systolic murmur at the apex. RESPIRATORY:  Clear to auscultation without rales, wheezing or rhonchi  ABDOMEN: Soft,  non-tender, non-distended MUSCULOSKELETAL:  No edema; No deformity  SKIN: Warm and dry NEUROLOGIC:  Alert and oriented x 3 PSYCHIATRIC:  Normal affect   Signed, Jenean Lindau, MD  10/18/2017 11:46 AM    [place]

## 2017-10-18 NOTE — Patient Instructions (Signed)
Medication Instructions:  Your physician has recommended you make the following change in your medication:  START (pitavastatin calcium) livalo 2 mg tablets: take 0.5 tablet daily  Labwork: Your physician recommends that you return for lab work in 1 month: BMP, Lipid panel, hepatic function panel.   Testing/Procedures: None  Follow-Up: Your physician wants you to follow-up in: 6 months.  You will receive a reminder letter in the mail two months in advance. If you don't receive a letter, please call our office to schedule the follow-up appointment.   If you need a refill on your cardiac medications before your next appointment, please call your pharmacy.   Thank you for choosing [facility]! Robyne Peers, RN [phone]    Pitavastatin oral tablets What is this medicine? PITAVASTATIN (pit A va STAT in) is known as a HMG-CoA reductase inhibitor or 'statin'. It lowers the level of cholesterol and triglycerides in the blood. Diet and lifestyle changes are often used with this drug. This medicine may be used for other purposes; ask your health care provider or pharmacist if you have questions. COMMON BRAND NAME(S): Livalo What should I tell my health care provider before I take this medicine? They need to know if you have any of these conditions: - frequently drink alcoholic beverages - kidney disease - liver disease - muscle aches or weakness - other medical condition - an unusual or allergic reaction to pitavastatin, other medicines, foods, dyes, or preservatives - pregnant or trying to get pregnant - breast-feeding How should I use this medicine? Take this medicine by mouth with a glass of water. Follow the directions on the prescription label. You can take this medicine with or without food. Take your doses at regular intervals. Do not take your medicine more often than directed. Talk to your pediatrician regarding the use of this medicine in children. Special care  may be needed. Overdosage: If you think you have taken too much of this medicine contact a poison control center or emergency room at once. NOTE: This medicine is only for you. Do not share this medicine with others. What if I miss a dose? If you miss a dose, take it as soon as you can. If it is almost time for your next dose, take only that dose. Do not take double or extra doses. What may interact with this medicine? Do not take this medicine with any of the following medications: - cyclosporine - gemfibrozil - herbal medicines like red yeast rice This medicine may also interact with the following medications: - alcohol - antiviral medicines for HIV or AIDS - erythromycin - other medicines for cholesterol - rifampin - warfarin This list may not describe all possible interactions. Give your health care provider a list of all the medicines, herbs, non-prescription drugs, or dietary supplements you use. Also tell them if you smoke, drink alcohol, or use illegal drugs. Some items may interact with your medicine. What should I watch for while using this medicine? Visit your doctor or health care professional for regular check-ups. You may need regular tests to make sure your liver is working properly. Tell your doctor or health care professional right away if you get any unexplained muscle pain, tenderness, or weakness, especially if you also have a fever and tiredness. Your doctor or health care professional may tell you to stop taking this medicine if you develop muscle problems. If your muscle problems do not go away after stopping this medicine, contact your health care professional. This medicine may affect  blood sugar levels. If you have diabetes, check with your doctor or health care professional before you change your diet or the dose of your diabetic medicine. This drug is only part of a total heart-health program. Your doctor or a dietician can suggest a low-cholesterol and low-fat diet  to help. Avoid alcohol and smoking, and keep a proper exercise schedule. Do not become pregnant while taking this medicine. Women should inform their doctor if they wish to become pregnant or think they might be pregnant. There is a potential for serious side effects to an unborn child. Do not breast-feed an infant while taking this medicine. Serious side effects to an unborn child or to an infant are possible. Talk to your doctor or pharmacist for more information. What side effects may I notice from receiving this medicine? Side effects that you should report to your doctor or health care professional as soon as possible: - allergic reactions like skin rash, itching or hives, swelling of the face, lips, or tongue - dark urine - fever - joint pain - muscle cramps or pain - redness, blistering, peeling or loosening of the skin, including inside the mouth - trouble passing urine or change in the amount of urine - unusually weak or tired - yellowing of the eyes or skin Side effects that usually do not require medical attention (report to your doctor or health care professional if they continue or are bothersome): - constipation - heartburn - nausea This list may not describe all possible side effects. Call your doctor for medical advice about side effects. You may report side effects to FDA at 1-800-FDA-1088. Where should I keep my medicine? Keep out of the reach of children. Store at room temperature between 15 and 30 degrees C (59 and 86 degrees F). Protect from light. Throw away any unused medicine after the expiration date. NOTE: This sheet is a summary. It may not cover all possible information. If you have questions about this medicine, talk to your doctor, pharmacist, or health care provider.  2018 Elsevier/Gold Standard (2015-04-16 21:19:41)

## 2017-10-20 NOTE — Progress Notes (Signed)
Patient referred by [name], NP, seen by me on 08/08/17, HST on 10/16/17.    Please call and notify the patient that the recent home sleep test showed obstructive sleep apnea. OSA is overall mild, but worth treating to see if he feels better after treatment. To that end I recommend treatment for this in the form of autoPAP, which means, that we don't have to bring him in for a sleep study with CPAP, but will let him try an autoPAP machine at home, through a DME company (of his choice, or as per insurance requirement). The DME representative will educate him on how to use the machine, how to put the mask on, etc. I have placed an order in the chart. Please send referral, talk to patient, send report to referring MD. We will need a FU in sleep clinic for 10 weeks post-PAP set up, please arrange that with me or one of our NPs. Thanks,   Star Age, MD, PhD [facility])

## 2017-10-20 NOTE — Addendum Note (Signed)
Addended by: Star Age on: 10/20/2017 02:04 PM   Modules accepted: Orders

## 2017-10-20 NOTE — Procedures (Signed)
[facility] @[facility] [address] [place], [place] [address] NAME:   Patrick Levine                                                               DOB: [birth year] MEDICAL RECORD NUMBER [id number]                                                           DOS: 10/16/17  REFERRING PHYSICIAN: [name], NP STUDY PERFORMED: Home Sleep Test HISTORY: 42 year old man with a history of hypertension, hyperlipidemia, smoking, reflux disease and morbid obesity with a BMI of over 40, who reports snoring and excessive daytime somnolence, as well as witnessed apneic breathing pauses while asleep. His Epworth sleepiness score is 19 out of 24, BMI:41.1.   STUDY RESULTS:  Total Recording Time:  9 hours, 4 minutes Total Apnea/Hypopnea Index (AHI): 9.1/h, RDI: 10.3/h Average Oxygen Saturation: 96%, Lowest Oxygen Desaturation: 90%  Total Time Oxygen Saturation Below or at 88 %: 0 minutes  Average Heart Rate:   68 bpm  IMPRESSION: OSA  RECOMMENDATION: RECOMMENDATION: This home sleep test demonstrates overall mild obstructive sleep apnea (by number of events and O2 at or above 90%), with a total AHI of 9.1/hour and O2 nadir of 90%. Given the patient's medical history and sleep related complaints, treatment with positive airway pressure is recommended. This can be achieved in the form of autoPAP trial/titration at home. A full night CPAP titration study will help with proper treatment settings and mask fitting if needed. Alternative treatments include weight loss along with avoidance of the supine sleep position, or an oral appliance in appropriate candidates. Airway surgery can be considered in the right clinical setting. Please note that untreated obstructive sleep apnea may carry additional perioperative morbidity. Patients with significant obstructive sleep apnea should receive perioperative PAP therapy and the surgeons and particularly the anesthesiologist should be informed of the  diagnosis and the severity of the sleep disordered breathing. The patient should be cautioned not to drive, work at heights, or operate dangerous or heavy equipment when tired or sleepy. Review and reiteration of good sleep hygiene measures should be pursued with any patient. Other causes of the patient's symptoms, including circadian rhythm disturbances, an underlying mood disorder, medication effect and/or an underlying medical problem cannot be ruled out based on this test. Clinical correlation is recommended. The patient and his referring provider will be notified of the test results. The patient will be seen in follow up in sleep clinic at [facility].  I certify that I have reviewed the raw data recording prior to the issuance of this report in accordance with the standards of the American Academy of Sleep Medicine (AASM).  Star Age, MD, PhD [facility]) Diplomat, ABPN (Neurology and Sleep)

## 2017-10-23 ENCOUNTER — Telehealth: Payer: Self-pay

## 2017-10-23 ENCOUNTER — Ambulatory Visit: Payer: Managed Care, Other (non HMO) | Admitting: Nurse Practitioner

## 2017-10-23 NOTE — Telephone Encounter (Signed)
-----   Message from Star Age, MD sent at 10/20/2017  2:04 PM EDT ----- Patient referred by [name], NP, seen by me on 08/08/17, HST on 10/16/17.    Please call and notify the patient that the recent home sleep test showed obstructive sleep apnea. OSA is overall mild, but worth treating to see if he feels better after treatment. To that end I recommend treatment for this in the form of autoPAP, which means, that we don't have to bring him in for a sleep study with CPAP, but will let him try an autoPAP machine at home, through a DME company (of his choice, or as per insurance requirement). The DME representative will educate him on how to use the machine, how to put the mask on, etc. I have placed an order in the chart. Please send referral, talk to patient, send report to referring MD. We will need a FU in sleep clinic for 10 weeks post-PAP set up, please arrange that with me or one of our NPs. Thanks,   Star Age, MD, PhD [facility])

## 2017-10-23 NOTE — Telephone Encounter (Signed)
I called pt. I advised pt that Dr. Rexene Alberts reviewed their sleep study results and found that pt has mild osa. Dr. Rexene Alberts recommends that pt start an auto pap at home to treat this mild osa. I reviewed PAP compliance expectations with the pt. Pt is agreeable to starting an auto-PAP. I advised pt that an order will be sent to a DME, [facility], and [facility] will call the pt within about one week after they file with the pt's insurance. [facility] will show the pt how to use the machine, fit for masks, and troubleshoot the auto-PAP if needed. A follow up appt was made for insurance purposes with Dr. Rexene Alberts on 01/24/18 at 10:30am. Pt verbalized understanding to arrive 15 minutes early and bring their auto-PAP. A letter with all of this information in it will be mailed to the pt as a reminder. I verified with the pt that the address we have on file is correct. Pt verbalized understanding of results. Pt had no questions at this time but was encouraged to call back if questions arise.

## 2017-10-24 ENCOUNTER — Ambulatory Visit (INDEPENDENT_AMBULATORY_CARE_PROVIDER_SITE_OTHER): Payer: Managed Care, Other (non HMO) | Admitting: Nurse Practitioner

## 2017-10-24 ENCOUNTER — Encounter: Payer: Self-pay | Admitting: Nurse Practitioner

## 2017-10-24 VITALS — BP 140/90 | HR 78 | Temp 98.3°F | Ht 71.0 in | Wt 247.8 lb

## 2017-10-24 DIAGNOSIS — I10 Essential (primary) hypertension: Secondary | ICD-10-CM | POA: Diagnosis not present

## 2017-10-24 DIAGNOSIS — R739 Hyperglycemia, unspecified: Secondary | ICD-10-CM | POA: Diagnosis not present

## 2017-10-24 DIAGNOSIS — E781 Pure hyperglyceridemia: Secondary | ICD-10-CM

## 2017-10-24 LAB — LIPID PANEL
CHOLESTEROL: 235 mg/dL — AB (ref 0–200)
HDL: 54.5 mg/dL (ref >39.00)
NONHDL: 180
TRIGLYCERIDES: 337 mg/dL — AB (ref 0.0–149.0)
Total CHOL/HDL Ratio: 4
VLDL: 67.4 mg/dL — ABNORMAL HIGH (ref 0.0–40.0)

## 2017-10-24 LAB — HEPATIC FUNCTION PANEL
ALBUMIN: 4.4 g/dL (ref 3.5–5.2)
ALT: 39 U/L (ref 0–53)
AST: 32 U/L (ref 0–37)
Alkaline Phosphatase: 57 U/L (ref 39–117)
BILIRUBIN DIRECT: 0.1 mg/dL (ref 0.0–0.3)
BILIRUBIN TOTAL: 0.4 mg/dL (ref 0.2–1.2)
TOTAL PROTEIN: 7.8 g/dL (ref 6.0–8.3)

## 2017-10-24 LAB — BASIC METABOLIC PANEL
BUN: 11 mg/dL (ref 6–23)
CO2: 26 meq/L (ref 19–32)
CREATININE: 0.98 mg/dL (ref 0.40–1.50)
Calcium: 10 mg/dL (ref 8.4–10.5)
Chloride: 105 mEq/L (ref 96–112)
GFR: 107.59 mL/min (ref >60.00)
GLUCOSE: 123 mg/dL — AB (ref 70–99)
Potassium: 3.9 mEq/L (ref 3.5–5.1)
Sodium: 139 mEq/L (ref 135–145)

## 2017-10-24 LAB — HEMOGLOBIN A1C: HEMOGLOBIN A1C: 5.9 % (ref 4.6–6.5)

## 2017-10-24 LAB — LDL CHOLESTEROL, DIRECT: LDL DIRECT: 139 mg/dL

## 2017-10-24 MED FILL — OMEGA-3 ETHYL ESTERS 1 GM C: 1 | 30 days supply | Qty: 60 | Fill #0

## 2017-10-24 NOTE — Patient Instructions (Addendum)
Start pitavastatin. You may titrate up: 1tab at bedtime 2x/week x1week, then 1tab 3x/week at bedtime x 2weeks, then 1tab daily at bedtime continuous.  Continue other medications as prescribed.  Lipid panel continuous to be abnormal with very high levels of triglyceride. It is imperative that you take lipid lowering medications as prescribed. Stable Hgb A1c at 5.9 Stable renal and liver function.

## 2017-10-24 NOTE — Progress Notes (Signed)
Subjective:  Patient ID: Patrick Levine, male    DOB: Mar 13, 1976  Age: 42 y.o. MRN: [id number]  CC: Follow-up (pt is f/u on bp, fasting , takes his bp meds daily, pt sts his bp been 130/50. Pt recently quit smoking 10 days ago. )  HPI  HTN: Improved with amlodipine and edarbyclor. Sleep study complete: positive OSA, waiting for CPAP to be delivered BP Readings from Last 3 Encounters:  10/24/17 140/90  10/18/17 (!) 132/96  09/13/17 132/78   Hyperlipidemia: Taking only lovaza at this time. Did not take fenofibrate nor pitavastatin due to fear of side effects. Lipid Panel     Component Value Date/Time   CHOL 235 (H) 10/24/2017 1409   TRIG 337.0 (H) 10/24/2017 1409   HDL 54.50 10/24/2017 1409   CHOLHDL 4 10/24/2017 1409   VLDL 67.4 (H) 10/24/2017 1409   LDLDIRECT 139.0 10/24/2017 1409    no exercise at this time. Make some changes to diet (low sodium) Stopped tobacco use 10days ago.  Reviewed past Medical, Social and Family history today.  Outpatient Medications Prior to Visit  Medication Sig Dispense Refill  . fenofibrate (TRICOR) 145 MG tablet Take 1 tablet (145 mg total) by mouth daily. 90 tablet 1  . Multiple Vitamin (ONE-A-DAY MENS PO) Take 1 tablet by mouth.      Marland Kitchen omeprazole (PRILOSEC) 20 MG capsule Take 1 capsule (20 mg total) by mouth daily. 90 capsule 1  . Pitavastatin Calcium 2 MG TABS Take 0.5 tablets (1 mg total) by mouth daily. 30 tablet 3  . amLODipine (NORVASC) 10 MG tablet Take 1 tablet (10 mg total) by mouth daily. 90 tablet 1  . Azilsartan-Chlorthalidone 40-12.5 MG TABS Take 1 tablet by mouth daily. 90 tablet 1  . cephALEXin (KEFLEX) 500 MG capsule Take 1 capsule (500 mg total) by mouth 4 (four) times daily. 28 capsule 0  . hydrochlorothiazide (MICROZIDE) 12.5 MG capsule Take 12.5 mg by mouth daily.    Marland Kitchen omega-3 acid ethyl esters (LOVAZA) 1 g capsule TAKE 1 CAPSULE BY MOUTH 2 TIMES DAILY 60 capsule 0   No facility-administered medications prior to  visit.     ROS See HPI  Objective:  BP 140/90   Pulse 78   Temp 98.3 F (36.8 C)   Ht 5\' 11"  (1.803 m)   Wt 247 lb 12.8 oz (112.4 kg)   SpO2 98%   BMI 34.56 kg/m   BP Readings from Last 3 Encounters:  10/24/17 140/90  10/18/17 (!) 132/96  09/13/17 132/78    Wt Readings from Last 3 Encounters:  10/24/17 247 lb 12.8 oz (112.4 kg)  10/18/17 251 lb (113.9 kg)  09/13/17 252 lb (114.3 kg)    Physical Exam  Constitutional: He is oriented to person, place, and time. No distress.  Neck: No JVD present.  Cardiovascular: Normal rate, regular rhythm, normal heart sounds and intact distal pulses.  Pulmonary/Chest: Effort normal and breath sounds normal.  Musculoskeletal: He exhibits no edema.  Neurological: He is alert and oriented to person, place, and time.  Vitals reviewed.   Lab Results  Component Value Date   WBC 5.5 09/11/2017   HGB 12.4 (L) 09/11/2017   HCT 35.9 (L) 09/11/2017   PLT 209 09/11/2017   GLUCOSE 123 (H) 10/24/2017   CHOL 235 (H) 10/24/2017   TRIG 337.0 (H) 10/24/2017   HDL 54.50 10/24/2017   LDLDIRECT 139.0 10/24/2017   ALT 39 10/24/2017   AST 32 10/24/2017   NA 139 10/24/2017  K 3.9 10/24/2017   CL 105 10/24/2017   CREATININE 0.98 10/24/2017   BUN 11 10/24/2017   CO2 26 10/24/2017   TSH 1.04 06/21/2017   HGBA1C 5.9 10/24/2017    Dg Chest 2 View  Result Date: 09/11/2017 CLINICAL DATA:  Left-sided chest pain for 5-6 days EXAM: CHEST - 2 VIEW COMPARISON:  05/03/2016 FINDINGS: Normal heart size and mediastinal contours. No acute infiltrate or edema. No effusion or pneumothorax. No acute osseous findings. IMPRESSION: Negative chest. Electronically Signed   By: Monte Fantasia M.D.   On: 09/11/2017 09:31   Ct Angio Chest Pe W And/or Wo Contrast  Result Date: 09/11/2017 CLINICAL DATA:  Chest pain and shortness of breath EXAM: CT ANGIOGRAPHY CHEST WITH CONTRAST TECHNIQUE: Multidetector CT imaging of the chest was performed using the standard  protocol during bolus administration of intravenous contrast. Multiplanar CT image reconstructions and MIPs were obtained to evaluate the vascular anatomy. CONTRAST:  182mL ISOVUE-370 IOPAMIDOL (ISOVUE-370) INJECTION 76% COMPARISON:  Chest radiograph December 20, 2011; chest radiograph September 11, 2017 FINDINGS: Cardiovascular: There is no demonstrable pulmonary embolus. There is no thoracic aortic aneurysm or dissection. Visualized great vessels appear unremarkable. No pericardial effusion or pericardial thickening evident. Mediastinum/Nodes: Thyroid appears unremarkable. There is no appreciable thoracic adenopathy. No esophageal lesions are appreciable. Lungs/Pleura: Lungs are clear. No pleural effusion or pleural thickening evident. Upper Abdomen: Visualized upper abdominal structures appear unremarkable. Musculoskeletal: There are no blastic or lytic bone lesions. No evident chest wall lesion. Review of the MIP images confirms the above findings. IMPRESSION: 1. No demonstrable pulmonary embolus. No thoracic aortic aneurysm or dissection. 2.  Lungs clear. 3.  No demonstrable thoracic adenopathy. Electronically Signed   By: Lowella Grip III M.D.   On: 09/11/2017 10:49   US Venous Img Lower Bilateral  Result Date: 09/11/2017 CLINICAL DATA:  Bilateral lower extremity pain and edema since Friday. Recent car trip. Evaluate for DVT. EXAM: BILATERAL LOWER EXTREMITY VENOUS DOPPLER ULTRASOUND TECHNIQUE: Gray-scale sonography with graded compression, as well as color Doppler and duplex ultrasound were performed to evaluate the lower extremity deep venous systems from the level of the common femoral vein and including the common femoral, femoral, profunda femoral, popliteal and calf veins including the posterior tibial, peroneal and gastrocnemius veins when visible. The superficial great saphenous vein was also interrogated. Spectral Doppler was utilized to evaluate flow at rest and with distal augmentation maneuvers  in the common femoral, femoral and popliteal veins. COMPARISON:  None. FINDINGS: RIGHT LOWER EXTREMITY Common Femoral Vein: No evidence of thrombus. Normal compressibility, respiratory phasicity and response to augmentation. Saphenofemoral Junction: No evidence of thrombus. Normal compressibility and flow on color Doppler imaging. Profunda Femoral Vein: No evidence of thrombus. Normal compressibility and flow on color Doppler imaging. Femoral Vein: No evidence of thrombus. Normal compressibility, respiratory phasicity and response to augmentation. Popliteal Vein: No evidence of thrombus. Normal compressibility, respiratory phasicity and response to augmentation. Calf Veins: No evidence of thrombus. Normal compressibility and flow on color Doppler imaging. Superficial Great Saphenous Vein: No evidence of thrombus. Normal compressibility. Venous Reflux:  None. Other Findings:  None. LEFT LOWER EXTREMITY Common Femoral Vein: No evidence of thrombus. Normal compressibility, respiratory phasicity and response to augmentation. Saphenofemoral Junction: No evidence of thrombus. Normal compressibility and flow on color Doppler imaging. Profunda Femoral Vein: No evidence of thrombus. Normal compressibility and flow on color Doppler imaging. Femoral Vein: No evidence of thrombus. Normal compressibility, respiratory phasicity and response to augmentation. Popliteal Vein: No evidence of thrombus.  Normal compressibility, respiratory phasicity and response to augmentation. Calf Veins: No evidence of thrombus. Normal compressibility and flow on color Doppler imaging. Superficial Great Saphenous Vein: No evidence of thrombus. Normal compressibility. Venous Reflux:  None. Other Findings:  None. IMPRESSION: No evidence of DVT within either lower extremity. Electronically Signed   By: Sandi Mariscal M.D.   On: 09/11/2017 11:42    Assessment & Plan:   Patrick Levine was seen today for follow-up.  Diagnoses and all orders for this  visit:  Essential hypertension -     Basic metabolic panel -     amLODipine (NORVASC) 10 MG tablet; Take 1 tablet (10 mg total) by mouth daily. -     Azilsartan-Chlorthalidone 40-12.5 MG TABS; Take 1 tablet by mouth daily.  Pure hyperglyceridemia -     Lipid panel -     Hepatic function panel -     omega-3 acid ethyl esters (LOVAZA) 1 g capsule; Take 2 capsules (2 g total) by mouth 2 (two) times daily.  Hyperglycemia -     Basic metabolic panel -     Hemoglobin A1c  Other orders -     LDL cholesterol, direct   I have discontinued Radford Carline's hydrochlorothiazide and cephALEXin. I have also changed his omega-3 acid ethyl esters. Additionally, I am having him maintain his Multiple Vitamin (ONE-A-DAY MENS PO), fenofibrate, omeprazole, Pitavastatin Calcium, amLODipine, and Azilsartan-Chlorthalidone.  Meds ordered this encounter  Medications  . amLODipine (NORVASC) 10 MG tablet    Sig: Take 1 tablet (10 mg total) by mouth daily.    Dispense:  90 tablet    Refill:  3    Order Specific Question:   Supervising Provider    Answer:   Lucille Passy [[id number]]  . Azilsartan-Chlorthalidone 40-12.5 MG TABS    Sig: Take 1 tablet by mouth daily.    Dispense:  90 tablet    Refill:  3    Order Specific Question:   Supervising Provider    Answer:   Lucille Passy [[id number]]  . omega-3 acid ethyl esters (LOVAZA) 1 g capsule    Sig: Take 2 capsules (2 g total) by mouth 2 (two) times daily.    Dispense:  120 capsule    Refill:  11    DX Code Needed: Hypertriglycerimia    Order Specific Question:   Supervising Provider    Answer:   Lucille Passy [[id number]]    Follow-up: Return in about 6 months (around 04/26/2018) for HTN and , hyperlipidemia (fasting).  [name], NP

## 2017-10-25 ENCOUNTER — Telehealth: Payer: Self-pay | Admitting: Nurse Practitioner

## 2017-10-25 ENCOUNTER — Encounter: Payer: Self-pay | Admitting: Nurse Practitioner

## 2017-10-25 DIAGNOSIS — I10 Essential (primary) hypertension: Secondary | ICD-10-CM

## 2017-10-25 MED ORDER — CANDESARTAN CILEXETIL-HCTZ 32-12.5 MG PO TABS: 1.0000 | ORAL_TABLET | Freq: Every day | ORAL | 3 refills | Status: DC

## 2017-10-25 MED ORDER — OMEGA-3-ACID ETHYL ESTERS 1 G PO CAPS: 2.0000 g | ORAL_CAPSULE | Freq: Two times a day (BID) | ORAL | 11 refills | Status: DC

## 2017-10-25 MED ORDER — AMLODIPINE BESYLATE 10 MG PO TABS: 10.0000 mg | ORAL_TABLET | Freq: Every day | ORAL | 3 refills | Status: DC

## 2017-10-25 MED ORDER — AZILSARTAN-CHLORTHALIDONE 40-12.5 MG PO TABS: 1.0000 | ORAL_TABLET | Freq: Every day | ORAL | 3 refills | Status: DC

## 2017-10-25 NOTE — Telephone Encounter (Signed)
Received fax from pharmacy stating Patrick Levine 40-12.5mg  is not cover by insurance, they require for the pt to try Ace inhibitor ave -I combo, ARBs, or ARBs combo first. Please advise.

## 2017-10-26 MED FILL — CANDESARTAN-HCTZ 32-12.5 MG: 32-12.5 | 90 days supply | Qty: 90 | Fill #0

## 2017-10-26 NOTE — Telephone Encounter (Signed)
Spoke with the pt, he is aware of new met. He can take this med with amlodipine in the morning or in the evening (just once daily).

## 2017-11-24 MED FILL — LIVALO 2 MG TABLET: 2 | 60 days supply | Qty: 30 | Fill #0

## 2017-12-15 MED FILL — AMLODIPINE BESYLATE 10 MG T: 10 | 90 days supply | Qty: 90 | Fill #1

## 2017-12-15 MED FILL — OMEPRAZOLE 20 MG CPDR: 20 | 90 days supply | Qty: 90 | Fill #1

## 2017-12-21 ENCOUNTER — Ambulatory Visit (INDEPENDENT_AMBULATORY_CARE_PROVIDER_SITE_OTHER): Payer: Managed Care, Other (non HMO) | Admitting: Nurse Practitioner

## 2017-12-21 VITALS — BP 142/90 | HR 74 | Temp 98.4°F | Ht 71.0 in | Wt 255.2 lb

## 2017-12-21 DIAGNOSIS — I10 Essential (primary) hypertension: Secondary | ICD-10-CM | POA: Diagnosis not present

## 2017-12-21 NOTE — Progress Notes (Signed)
Subjective:  Patient ID: Patrick Levine, male    DOB: 10/11/1975  Age: 42 y.o. MRN: [id number]  CC: Medication Problem (concerned about losartan rx)  HPI  Patrick Levine is here to review medication. He thinks he has been taking losartan for HTN, and concerned about pharmaceutical recall.  Reviewed past Medical, Social and Family history today.  Outpatient Medications Prior to Visit  Medication Sig Dispense Refill  . amLODipine (NORVASC) 10 MG tablet Take 1 tablet (10 mg total) by mouth daily. 90 tablet 3  . candesartan-hydrochlorothiazide (ATACAND HCT) 32-12.5 MG tablet Take 1 tablet by mouth daily. 90 tablet 3  . Multiple Vitamin (ONE-A-DAY MENS PO) Take 1 tablet by mouth.      . omega-3 acid ethyl esters (LOVAZA) 1 g capsule Take 2 capsules (2 g total) by mouth 2 (two) times daily. 120 capsule 11  . omeprazole (PRILOSEC) 20 MG capsule Take 1 capsule (20 mg total) by mouth daily. 90 capsule 1  . Pitavastatin Calcium 2 MG TABS Take 0.5 tablets (1 mg total) by mouth daily. 30 tablet 3  . fenofibrate (TRICOR) 145 MG tablet Take 1 tablet (145 mg total) by mouth daily. 90 tablet 1   No facility-administered medications prior to visit.     ROS See HPI  Objective:  BP (!) 142/90 (BP Location: Left Arm, Patient Position: Sitting, Cuff Size: Large)   Pulse 74   Temp 98.4 F (36.9 C) (Oral)   Ht 5\' 11"  (1.803 m)   Wt 255 lb 3.2 oz (115.8 kg)   SpO2 97%   BMI 35.59 kg/m   BP Readings from Last 3 Encounters:  12/21/17 (!) 142/90  10/24/17 140/90  10/18/17 (!) 132/96    Wt Readings from Last 3 Encounters:  12/21/17 255 lb 3.2 oz (115.8 kg)  10/24/17 247 lb 12.8 oz (112.4 kg)  10/18/17 251 lb (113.9 kg)    Physical Exam  Constitutional: He is oriented to person, place, and time. He appears well-developed and well-nourished.  Cardiovascular: Normal rate.  Pulmonary/Chest: Effort normal.  Neurological: He is alert and oriented to person, place, and time.    Psychiatric: He has a normal mood and affect. His behavior is normal. Thought content normal.  Vitals reviewed.   Lab Results  Component Value Date   WBC 5.5 09/11/2017   HGB 12.4 (L) 09/11/2017   HCT 35.9 (L) 09/11/2017   PLT 209 09/11/2017   GLUCOSE 123 (H) 10/24/2017   CHOL 235 (H) 10/24/2017   TRIG 337.0 (H) 10/24/2017   HDL 54.50 10/24/2017   LDLDIRECT 139.0 10/24/2017   ALT 39 10/24/2017   AST 32 10/24/2017   NA 139 10/24/2017   K 3.9 10/24/2017   CL 105 10/24/2017   CREATININE 0.98 10/24/2017   BUN 11 10/24/2017   CO2 26 10/24/2017   TSH 1.04 06/21/2017   HGBA1C 5.9 10/24/2017    Dg Chest 2 View  Result Date: 09/11/2017 CLINICAL DATA:  Left-sided chest pain for 5-6 days EXAM: CHEST - 2 VIEW COMPARISON:  05/03/2016 FINDINGS: Normal heart size and mediastinal contours. No acute infiltrate or edema. No effusion or pneumothorax. No acute osseous findings. IMPRESSION: Negative chest. Electronically Signed   By: Monte Fantasia M.D.   On: 09/11/2017 09:31   Ct Angio Chest Pe W And/or Wo Contrast  Result Date: 09/11/2017 CLINICAL DATA:  Chest pain and shortness of breath EXAM: CT ANGIOGRAPHY CHEST WITH CONTRAST TECHNIQUE: Multidetector CT imaging of the chest was performed using the standard protocol during  bolus administration of intravenous contrast. Multiplanar CT image reconstructions and MIPs were obtained to evaluate the vascular anatomy. CONTRAST:  162mL ISOVUE-370 IOPAMIDOL (ISOVUE-370) INJECTION 76% COMPARISON:  Chest radiograph December 20, 2011; chest radiograph September 11, 2017 FINDINGS: Cardiovascular: There is no demonstrable pulmonary embolus. There is no thoracic aortic aneurysm or dissection. Visualized great vessels appear unremarkable. No pericardial effusion or pericardial thickening evident. Mediastinum/Nodes: Thyroid appears unremarkable. There is no appreciable thoracic adenopathy. No esophageal lesions are appreciable. Lungs/Pleura: Lungs are clear. No pleural  effusion or pleural thickening evident. Upper Abdomen: Visualized upper abdominal structures appear unremarkable. Musculoskeletal: There are no blastic or lytic bone lesions. No evident chest wall lesion. Review of the MIP images confirms the above findings. IMPRESSION: 1. No demonstrable pulmonary embolus. No thoracic aortic aneurysm or dissection. 2.  Lungs clear. 3.  No demonstrable thoracic adenopathy. Electronically Signed   By: Lowella Grip III M.D.   On: 09/11/2017 10:49   US Venous Img Lower Bilateral  Result Date: 09/11/2017 CLINICAL DATA:  Bilateral lower extremity pain and edema since Friday. Recent car trip. Evaluate for DVT. EXAM: BILATERAL LOWER EXTREMITY VENOUS DOPPLER ULTRASOUND TECHNIQUE: Gray-scale sonography with graded compression, as well as color Doppler and duplex ultrasound were performed to evaluate the lower extremity deep venous systems from the level of the common femoral vein and including the common femoral, femoral, profunda femoral, popliteal and calf veins including the posterior tibial, peroneal and gastrocnemius veins when visible. The superficial great saphenous vein was also interrogated. Spectral Doppler was utilized to evaluate flow at rest and with distal augmentation maneuvers in the common femoral, femoral and popliteal veins. COMPARISON:  None. FINDINGS: RIGHT LOWER EXTREMITY Common Femoral Vein: No evidence of thrombus. Normal compressibility, respiratory phasicity and response to augmentation. Saphenofemoral Junction: No evidence of thrombus. Normal compressibility and flow on color Doppler imaging. Profunda Femoral Vein: No evidence of thrombus. Normal compressibility and flow on color Doppler imaging. Femoral Vein: No evidence of thrombus. Normal compressibility, respiratory phasicity and response to augmentation. Popliteal Vein: No evidence of thrombus. Normal compressibility, respiratory phasicity and response to augmentation. Calf Veins: No evidence of  thrombus. Normal compressibility and flow on color Doppler imaging. Superficial Great Saphenous Vein: No evidence of thrombus. Normal compressibility. Venous Reflux:  None. Other Findings:  None. LEFT LOWER EXTREMITY Common Femoral Vein: No evidence of thrombus. Normal compressibility, respiratory phasicity and response to augmentation. Saphenofemoral Junction: No evidence of thrombus. Normal compressibility and flow on color Doppler imaging. Profunda Femoral Vein: No evidence of thrombus. Normal compressibility and flow on color Doppler imaging. Femoral Vein: No evidence of thrombus. Normal compressibility, respiratory phasicity and response to augmentation. Popliteal Vein: No evidence of thrombus. Normal compressibility, respiratory phasicity and response to augmentation. Calf Veins: No evidence of thrombus. Normal compressibility and flow on color Doppler imaging. Superficial Great Saphenous Vein: No evidence of thrombus. Normal compressibility. Venous Reflux:  None. Other Findings:  None. IMPRESSION: No evidence of DVT within either lower extremity. Electronically Signed   By: Sandi Mariscal M.D.   On: 09/11/2017 11:42    Assessment & Plan:   I reviewed his medication list. I informed him that his bottle is an old prescription from previous provider. Losartan was noot prescribed by me and he should not be on it. He is currently taking atacand for HTN. He verbalized understanding and discarded old prescription.  Patrick Levine was seen today for medication problem.  Diagnoses and all orders for this visit:  Essential hypertension   I have discontinued Patrick Edelman  Levine's fenofibrate. I am also having him maintain his Multiple Vitamin (ONE-A-DAY MENS PO), omeprazole, Pitavastatin Calcium, amLODipine, omega-3 acid ethyl esters, and candesartan-hydrochlorothiazide.  No orders of the defined types were placed in this encounter.   Follow-up: No follow-ups on file.  [name], NP

## 2017-12-22 ENCOUNTER — Encounter: Payer: Self-pay | Admitting: Nurse Practitioner

## 2018-01-09 MED FILL — CANDESARTAN-HCTZ 32-12.5 MG: 32-12.5 | 90 days supply | Qty: 90 | Fill #1

## 2018-01-09 MED FILL — OMEGA-3 ETHYL ESTERS 1 GM C: 1 | 30 days supply | Qty: 120 | Fill #0

## 2018-01-24 ENCOUNTER — Telehealth: Payer: Self-pay

## 2018-01-24 ENCOUNTER — Ambulatory Visit: Payer: Self-pay | Admitting: Neurology

## 2018-01-24 NOTE — Telephone Encounter (Signed)
Pt did not show for their appt with Dr. Athar today.  

## 2018-01-25 ENCOUNTER — Encounter: Payer: Self-pay | Admitting: Neurology

## 2018-04-13 ENCOUNTER — Other Ambulatory Visit: Payer: Self-pay | Admitting: Nurse Practitioner

## 2018-04-13 DIAGNOSIS — K219 Gastro-esophageal reflux disease without esophagitis: Secondary | ICD-10-CM

## 2018-04-13 DIAGNOSIS — J040 Acute laryngitis: Secondary | ICD-10-CM

## 2018-04-13 MED FILL — AMLODIPINE BESYLATE 10 MG T: 10 | 90 days supply | Qty: 90 | Fill #0

## 2018-04-13 MED FILL — CANDESARTAN-HCTZ 32-12.5 MG: 32-12.5 | 90 days supply | Qty: 90 | Fill #2

## 2018-04-13 MED FILL — OMEGA-3 ETHYL ESTERS 1 GM C: 1 | 30 days supply | Qty: 120 | Fill #1

## 2018-04-13 MED FILL — OMEPRAZOLE 20 MG CPDR: 20 | 90 days supply | Qty: 90 | Fill #0

## 2018-04-13 NOTE — Telephone Encounter (Signed)
Rx sent 

## 2018-04-16 MED FILL — LIVALO 2 MG TABLET: 2 | 60 days supply | Qty: 30 | Fill #1

## 2018-04-27 ENCOUNTER — Ambulatory Visit: Payer: Managed Care, Other (non HMO) | Admitting: Nurse Practitioner

## 2018-04-27 DIAGNOSIS — Z0289 Encounter for other administrative examinations: Secondary | ICD-10-CM

## 2018-05-17 ENCOUNTER — Encounter: Payer: Self-pay | Admitting: Nurse Practitioner

## 2018-05-25 ENCOUNTER — Ambulatory Visit: Payer: Managed Care, Other (non HMO) | Admitting: Nurse Practitioner

## 2018-05-25 ENCOUNTER — Encounter: Payer: Self-pay | Admitting: Nurse Practitioner

## 2018-07-16 ENCOUNTER — Ambulatory Visit (INDEPENDENT_AMBULATORY_CARE_PROVIDER_SITE_OTHER): Payer: Managed Care, Other (non HMO) | Admitting: Nurse Practitioner

## 2018-07-16 ENCOUNTER — Encounter: Payer: Self-pay | Admitting: Nurse Practitioner

## 2018-07-16 ENCOUNTER — Other Ambulatory Visit: Payer: Self-pay | Admitting: Nurse Practitioner

## 2018-07-16 VITALS — BP 144/92 | HR 87 | Temp 95.1°F | Ht 71.0 in | Wt 263.0 lb

## 2018-07-16 DIAGNOSIS — Z91030 Bee allergy status: Secondary | ICD-10-CM

## 2018-07-16 DIAGNOSIS — Z6836 Body mass index (BMI) 36.0-36.9, adult: Secondary | ICD-10-CM

## 2018-07-16 DIAGNOSIS — E781 Pure hyperglyceridemia: Secondary | ICD-10-CM | POA: Diagnosis not present

## 2018-07-16 DIAGNOSIS — I10 Essential (primary) hypertension: Secondary | ICD-10-CM | POA: Diagnosis not present

## 2018-07-16 DIAGNOSIS — K219 Gastro-esophageal reflux disease without esophagitis: Secondary | ICD-10-CM

## 2018-07-16 DIAGNOSIS — E669 Obesity, unspecified: Secondary | ICD-10-CM | POA: Insufficient documentation

## 2018-07-16 DIAGNOSIS — J040 Acute laryngitis: Secondary | ICD-10-CM

## 2018-07-16 MED ORDER — EPINEPHRINE 0.3 MG/0.3ML IJ SOAJ: 0.3000 mg | INTRAMUSCULAR | 1 refills | Status: AC | PRN

## 2018-07-16 MED ORDER — OMEGA-3-ACID ETHYL ESTERS 1 G PO CAPS: 2.0000 g | ORAL_CAPSULE | Freq: Two times a day (BID) | ORAL | 3 refills | Status: DC

## 2018-07-16 MED ORDER — CANDESARTAN CILEXETIL-HCTZ 32-12.5 MG PO TABS: 1.0000 | ORAL_TABLET | Freq: Every day | ORAL | 1 refills | Status: DC

## 2018-07-16 MED ORDER — AMLODIPINE BESYLATE 10 MG PO TABS: 10.0000 mg | ORAL_TABLET | Freq: Every day | ORAL | 1 refills | Status: DC

## 2018-07-16 MED ORDER — PITAVASTATIN CALCIUM 2 MG PO TABS: 0.5000 | ORAL_TABLET | Freq: Every day | ORAL | 1 refills | Status: DC

## 2018-07-16 MED FILL — AMLODIPINE BESYLATE 10 MG T: 10 | 90 days supply | Qty: 90 | Fill #0

## 2018-07-16 MED FILL — EPINEPHRINE 0.3 MG AUTO-INJ: 0.3 | 30 days supply | Qty: 2 | Fill #0

## 2018-07-16 MED FILL — LIVALO 2 MG TABLET: 2 | 90 days supply | Qty: 45 | Fill #0

## 2018-07-16 MED FILL — CANDESARTAN-HCTZ 32-12.5 MG: 32-12.5 | 90 days supply | Qty: 90 | Fill #0

## 2018-07-16 MED FILL — OMEGA-3 ETHYL ESTERS 1 GM C: 1 | 30 days supply | Qty: 120 | Fill #0

## 2018-07-16 MED FILL — OMEPRAZOLE 20 MG CPDR: 20 | 90 days supply | Qty: 90 | Fill #0

## 2018-07-16 NOTE — Progress Notes (Signed)
Virtual Visit via Video Note  I connected with Patrick Levine on 07/16/18 at  8:15 AM EDT by a video enabled telemedicine application and verified that I am speaking with the correct person using two identifiers.   I discussed the limitations of evaluation and management by telemedicine and the availability of in person appointments. The patient expressed understanding and agreed to proceed.  CC: F/u on HTN and DM--med refills,allergy med and epipen consult--pt report taking otc cholesterol med.   His wife also present during office visit.  History of Present Illness:  HTN: Stable but not at goal <140/80. Current use of amlodipine and atacand HCTZ. Admits to not being compliant with diet and exercise. BP Readings from Last 3 Encounters:  07/16/18 (!) 144/92  12/21/17 (!) 142/90  10/24/17 140/90   Wt Readings from Last 3 Encounters:  07/16/18 263 lb (119.3 kg)  12/21/17 255 lb 3.2 oz (115.8 kg)  10/24/17 247 lb 12.8 oz (112.4 kg)   Hyperlipidemia: Has not taken pitavastatin for last 52month. Admits to not being compliant with diet and exercise. Also admits to increased consumption of ETOH (4-5 bottles of beer per day and 3shots of liquor).  Observations/Objective: Physical Exam  Constitutional: He is oriented to person, place, and time.  Neck: Normal range of motion. Neck supple.  Pulmonary/Chest: Effort normal.  Neurological: He is alert and oriented to person, place, and time.  Psychiatric: He has a normal mood and affect. His behavior is normal. Thought content normal.  Vitals reviewed.  Assessment and Plan: [name] was seen today for follow-up.  Diagnoses and all orders for this visit:  Essential hypertension -     amLODipine (NORVASC) 10 MG tablet; Take 1 tablet (10 mg total) by mouth daily. -     candesartan-hydrochlorothiazide (ATACAND HCT) 32-12.5 MG tablet; Take 1 tablet by mouth daily.  Pure hyperglyceridemia -     omega-3 acid ethyl esters (LOVAZA) 1 g  capsule; Take 2 capsules (2 g total) by mouth 2 (two) times daily. -     Pitavastatin Calcium 2 MG TABS; Take 0.5 tablets (1 mg total) by mouth daily.  Allergy to bee sting -     EPINEPHrine 0.3 mg/0.3 mL IJ SOAJ injection; Inject 0.3 mLs (0.3 mg total) into the muscle as needed for anaphylaxis.  Class 2 severe obesity due to excess calories with serious comorbidity and body mass index (BMI) of 36.0 to 36.9 in adult [facility])   I spent 60% of today's visit talking about risk of obesity when combined with comobidities like HTN, hyperlipidemia, prediabetes and FHx of CAD. His 10-year risk of atherosclerotic disease is 23.5%. I strongly recommend that he changed diet to DASH diet, decrease portion size, decrease ETOH to minimal amount (1-2drinks per day only), stop tobacco use, start walking 45mins per day.   Follow Up Instructions: Continue current medications. F/up in 61month for CPE (fasting)   I discussed the assessment and treatment plan with the patient. The patient was provided an opportunity to ask questions and all were answered. The patient agreed with the plan and demonstrated an understanding of the instructions.   The patient was advised to call back or seek an in-person evaluation if the symptoms worsen or if the condition fails to improve as anticipated.   [name], NP

## 2018-07-16 NOTE — Assessment & Plan Note (Signed)
I spent 60% of today's visit talking about risk of obesity when combined with comobidities like HTN, hyperlipidemia, prediabetes and FHx of CAD. His 10-year risk of atherosclerotic disease is 23.5%. I strongly recommend that he changed diet to DASH diet, decrease portion size, decrease ETOH to minimal amount (1-2drinks per day only), stop tobacco use, start walking 46mins per day. He verbalized understanding. F/up in 63month to repeat labs (fasting)

## 2018-08-17 ENCOUNTER — Encounter: Payer: Self-pay | Admitting: Nurse Practitioner

## 2018-08-17 ENCOUNTER — Ambulatory Visit (INDEPENDENT_AMBULATORY_CARE_PROVIDER_SITE_OTHER): Payer: Managed Care, Other (non HMO) | Admitting: Nurse Practitioner

## 2018-08-17 ENCOUNTER — Encounter: Payer: Managed Care, Other (non HMO) | Admitting: Nurse Practitioner

## 2018-08-17 VITALS — BP 140/90 | HR 75 | Temp 97.9°F | Ht 71.0 in | Wt 264.4 lb

## 2018-08-17 DIAGNOSIS — Z0001 Encounter for general adult medical examination with abnormal findings: Secondary | ICD-10-CM | POA: Diagnosis not present

## 2018-08-17 DIAGNOSIS — I10 Essential (primary) hypertension: Secondary | ICD-10-CM

## 2018-08-17 DIAGNOSIS — G4733 Obstructive sleep apnea (adult) (pediatric): Secondary | ICD-10-CM

## 2018-08-17 DIAGNOSIS — G4730 Sleep apnea, unspecified: Secondary | ICD-10-CM | POA: Insufficient documentation

## 2018-08-17 DIAGNOSIS — E781 Pure hyperglyceridemia: Secondary | ICD-10-CM

## 2018-08-17 DIAGNOSIS — E782 Mixed hyperlipidemia: Secondary | ICD-10-CM | POA: Diagnosis not present

## 2018-08-17 DIAGNOSIS — R739 Hyperglycemia, unspecified: Secondary | ICD-10-CM | POA: Diagnosis not present

## 2018-08-17 DIAGNOSIS — F1910 Other psychoactive substance abuse, uncomplicated: Secondary | ICD-10-CM | POA: Insufficient documentation

## 2018-08-17 LAB — CBC WITH DIFFERENTIAL/PLATELET
Basophils Absolute: 0 10*3/uL (ref 0.0–0.1)
Basophils Relative: 0.8 % (ref 0.0–3.0)
Eosinophils Absolute: 0.1 10*3/uL (ref 0.0–0.7)
Eosinophils Relative: 2.3 % (ref 0.0–5.0)
HCT: 43.9 % (ref 39.0–52.0)
Hemoglobin: 14.9 g/dL (ref 13.0–17.0)
Lymphocytes Relative: 30.1 % (ref 12.0–46.0)
Lymphs Abs: 1.8 10*3/uL (ref 0.7–4.0)
MCHC: 34 g/dL (ref 30.0–36.0)
MCV: 87.5 fl (ref 78.0–100.0)
Monocytes Absolute: 0.5 10*3/uL (ref 0.1–1.0)
Monocytes Relative: 8.8 % (ref 3.0–12.0)
Neutro Abs: 3.6 10*3/uL (ref 1.4–7.7)
Neutrophils Relative %: 58 % (ref 43.0–77.0)
Platelets: 309 10*3/uL (ref 150.0–400.0)
RBC: 5.01 Mil/uL (ref 4.22–5.81)
RDW: 14.3 % (ref 11.5–15.5)
WBC: 6.1 10*3/uL (ref 4.0–10.5)

## 2018-08-17 LAB — HEPATIC FUNCTION PANEL
ALT: 23 U/L (ref 0–53)
AST: 18 U/L (ref 0–37)
Albumin: 4.2 g/dL (ref 3.5–5.2)
Alkaline Phosphatase: 56 U/L (ref 39–117)
Bilirubin, Direct: 0 mg/dL (ref 0.0–0.3)
Total Bilirubin: 0.3 mg/dL (ref 0.2–1.2)
Total Protein: 7.1 g/dL (ref 6.0–8.3)

## 2018-08-17 LAB — BASIC METABOLIC PANEL
BUN: 12 mg/dL (ref 6–23)
CO2: 23 mEq/L (ref 19–32)
Calcium: 9.7 mg/dL (ref 8.4–10.5)
Chloride: 106 mEq/L (ref 96–112)
Creatinine, Ser: 0.92 mg/dL (ref 0.40–1.50)
GFR: 108.47 mL/min (ref >60.00)
Glucose, Bld: 106 mg/dL — ABNORMAL HIGH (ref 70–99)
Potassium: 4 mEq/L (ref 3.5–5.1)
Sodium: 139 mEq/L (ref 135–145)

## 2018-08-17 LAB — TSH: TSH: 1.17 u[IU]/mL (ref 0.35–4.50)

## 2018-08-17 LAB — LDL CHOLESTEROL, DIRECT: Direct LDL: 115 mg/dL

## 2018-08-17 LAB — LIPID PANEL
Cholesterol: 222 mg/dL — ABNORMAL HIGH (ref 0–200)
HDL: 54.6 mg/dL (ref >39.00)
NonHDL: 167.33
Total CHOL/HDL Ratio: 4
Triglycerides: 258 mg/dL — ABNORMAL HIGH (ref 0.0–149.0)
VLDL: 51.6 mg/dL — ABNORMAL HIGH (ref 0.0–40.0)

## 2018-08-17 LAB — HEMOGLOBIN A1C: Hgb A1c MFr Bld: 6.1 % (ref 4.6–6.5)

## 2018-08-17 MED ORDER — OMEGA-3-ACID ETHYL ESTERS 1 G PO CAPS: 2.0000 g | ORAL_CAPSULE | Freq: Two times a day (BID) | ORAL | 11 refills | Status: DC

## 2018-08-17 MED ORDER — AMLODIPINE BESYLATE 10 MG PO TABS: 10.0000 mg | ORAL_TABLET | Freq: Every day | ORAL | 3 refills | Status: DC

## 2018-08-17 MED ORDER — CANDESARTAN CILEXETIL-HCTZ 32-12.5 MG PO TABS: 1.0000 | ORAL_TABLET | Freq: Every day | ORAL | 1 refills | Status: DC

## 2018-08-17 MED ORDER — PITAVASTATIN CALCIUM 2 MG PO TABS: 0.5000 | ORAL_TABLET | Freq: Every day | ORAL | 3 refills | Status: DC

## 2018-08-17 MED FILL — OMEGA-3 ETHYL ESTERS 1 GM C: 1 | 30 days supply | Qty: 120 | Fill #0

## 2018-08-17 NOTE — Assessment & Plan Note (Signed)
Advised again about need for DASH diet, regular exercise, and medication compliance

## 2018-08-17 NOTE — Patient Instructions (Addendum)
Persistent abnormal lipid panel: elevated LDL and triglyceride Continue cholesterol lowering agents It is also imperative to make life style changes discussed in office. Persistent elevated glucose with hgb A1c at 6.1 Stable renal and liver function. Normal cbc   Health Maintenance, Male A healthy lifestyle and preventive care is important for your health and wellness. Ask your health care provider about what schedule of regular examinations is right for you. What should I know about weight and diet? Eat a Healthy Diet  Eat plenty of vegetables, fruits, whole grains, low-fat dairy products, and lean protein.  Do not eat a lot of foods high in solid fats, added sugars, or salt.  Maintain a Healthy Weight Regular exercise can help you achieve or maintain a healthy weight. You should:  Do at least 150 minutes of exercise each week. The exercise should increase your heart rate and make you sweat (moderate-intensity exercise).  Do strength-training exercises at least twice a week. Watch Your Levels of Cholesterol and Blood Lipids  Have your blood tested for lipids and cholesterol every 5 years starting at 43 years of age. If you are at high risk for heart disease, you should start having your blood tested when you are 43 years old. You may need to have your cholesterol levels checked more often if: ? Your lipid or cholesterol levels are high. ? You are older than 43 years of age. ? You are at high risk for heart disease. What should I know about cancer screening? Many types of cancers can be detected early and may often be prevented. Lung Cancer  You should be screened every year for lung cancer if: ? You are a current smoker who has smoked for at least 30 years. ? You are a former smoker who has quit within the past 15 years.  Talk to your health care provider about your screening options, when you should start screening, and how often you should be screened. Colorectal Cancer   Routine colorectal cancer screening usually begins at 43 years of age and should be repeated every 5-10 years until you are 43 years old. You may need to be screened more often if early forms of precancerous polyps or small growths are found. Your health care provider may recommend screening at an earlier age if you have risk factors for colon cancer.  Your health care provider may recommend using home test kits to check for hidden blood in the stool.  A small camera at the end of a tube can be used to examine your colon (sigmoidoscopy or colonoscopy). This checks for the earliest forms of colorectal cancer. Prostate and Testicular Cancer  Depending on your age and overall health, your health care provider may do certain tests to screen for prostate and testicular cancer.  Talk to your health care provider about any symptoms or concerns you have about testicular or prostate cancer. Skin Cancer  Check your skin from head to toe regularly.  Tell your health care provider about any new moles or changes in moles, especially if: ? There is a change in a mole's size, shape, or color. ? You have a mole that is larger than a pencil eraser.  Always use sunscreen. Apply sunscreen liberally and repeat throughout the day.  Protect yourself by wearing long sleeves, pants, a wide-brimmed hat, and sunglasses when outside. What should I know about heart disease, diabetes, and high blood pressure?  If you are 19-38 years of age, have your blood pressure checked every 3-5 years.  If you are 56 years of age or older, have your blood pressure checked every year. You should have your blood pressure measured twice-once when you are at a hospital or clinic, and once when you are not at a hospital or clinic. Record the average of the two measurements. To check your blood pressure when you are not at a hospital or clinic, you can use: ? An automated blood pressure machine at a pharmacy. ? A home blood pressure  monitor.  Talk to your health care provider about your target blood pressure.  If you are between 29-69 years old, ask your health care provider if you should take aspirin to prevent heart disease.  Have regular diabetes screenings by checking your fasting blood sugar level. ? If you are at a normal weight and have a low risk for diabetes, have this test once every three years after the age of 41. ? If you are overweight and have a high risk for diabetes, consider being tested at a younger age or more often.  A one-time screening for abdominal aortic aneurysm (AAA) by ultrasound is recommended for men aged 72-75 years who are current or former smokers. What should I know about preventing infection? Hepatitis B If you have a higher risk for hepatitis B, you should be screened for this virus. Talk with your health care provider to find out if you are at risk for hepatitis B infection. Hepatitis C Blood testing is recommended for:  Everyone born from [birth year] through 1965.  Anyone with known risk factors for hepatitis C. Sexually Transmitted Diseases (STDs)  You should be screened each year for STDs including gonorrhea and chlamydia if: ? You are sexually active and are younger than 43 years of age. ? You are older than 43 years of age and your health care provider tells you that you are at risk for this type of infection. ? Your sexual activity has changed since you were last screened and you are at an increased risk for chlamydia or gonorrhea. Ask your health care provider if you are at risk.  Talk with your health care provider about whether you are at high risk of being infected with HIV. Your health care provider may recommend a prescription medicine to help prevent HIV infection. What else can I do?  Schedule regular health, dental, and eye exams.  Stay current with your vaccines (immunizations).  Do not use any tobacco products, such as cigarettes, chewing tobacco, and e-cigarettes.  If you need help quitting, ask your health care provider.  Limit alcohol intake to no more than 2 drinks per day. One drink equals 12 ounces of beer, 5 ounces of wine, or 1 ounces of hard liquor.  Do not use street drugs.  Do not share needles.  Ask your health care provider for help if you need support or information about quitting drugs.  Tell your health care provider if you often feel depressed.  Tell your health care provider if you have ever been abused or do not feel safe at home. This information is not intended to replace advice given to you by your health care provider. Make sure you discuss any questions you have with your health care provider. Document Released: 09/10/2007 Document Revised: 11/11/2015 Document Reviewed: 12/16/2014 Elsevier Interactive Patient Education  2019 [employer].

## 2018-08-17 NOTE — Progress Notes (Signed)
Subjective:  Patient ID: Patrick Levine, male    DOB: [birth year]  Age: 43 y.o. MRN: [id number]  CC:  Chief Complaint  Patient presents with  . Annual Exam    CPE--had 3 cookies at 3am/pt havent take his BP meds today  . Hypertension   HPI Patrick Levine presents for annual physical and eval of chronic conditions.  report increased stress at home which has led to cocaine, marijuana and ETOH use. States he does not want referral to counseling. States cocaine use is recreational (2-3times) and has not plan to continue.  HTN: Stable with amlodipine, and atacand HCT. No exercise, no DASH diet Non compliant with autopap machine BP Readings from Last 3 Encounters:  08/17/18 140/90  07/16/18 (!) 144/92  12/21/17 (!) 142/90   Sleep Apnea: Positive mild OSA based on home sleep test per [facility], autopap ordered, he admits to not using daily  Hyperlipidemia: Not at goal. Has not made any changes to diet, no exercise regimen Lipid Panel     Component Value Date/Time   CHOL 222 (H) 08/17/2018 0927   TRIG 258.0 (H) 08/17/2018 0927   HDL 54.60 08/17/2018 0927   CHOLHDL 4 08/17/2018 0927   VLDL 51.6 (H) 08/17/2018 0927   LDLDIRECT 115.0 08/17/2018 0927   Wt Readings from Last 3 Encounters:  08/17/18 264 lb 6.4 oz (119.9 kg)  07/16/18 263 lb (119.3 kg)  12/21/17 255 lb 3.2 oz (115.8 kg)    Past Medical History:  Diagnosis Date  . Alcohol addiction ([place])   . High cholesterol   . Hypertension     Past Surgical History:  Procedure Laterality Date  . LACERATION REPAIR  2001   lip laceration--stitches.    Family History  Problem Relation Age of Onset  . Hypertension Father   . Hypertension Paternal Grandfather     Social History   Socioeconomic History  . Marital status: Married    Spouse name: Not on file  . Number of children: 4  . Years of education: Not on file  . Highest education level: Not on file  Occupational History  . Occupation: MENTAL  HEALTH/CASE MANAGER    Employer: [employer]  . Financial resource strain: Not on file  . Food insecurity:    Worry: Not on file    Inability: Not on file  . Transportation needs:    Medical: Not on file    Non-medical: Not on file  Tobacco Use  . Smoking status: Current Some Day Smoker    Packs/day: 0.50    Types: Cigarettes  . Smokeless tobacco: Never Used  Substance and Sexual Activity  . Alcohol use: Yes    Alcohol/week: 8.0 standard drinks    Types: 5 Cans of beer, 3 Shots of liquor per week    Comment: 6-8 drinks/day  . Drug use: Yes    Frequency: 3.0 times per week    Types: "Crack" cocaine, Marijuana    Comment:  use of cocaine and marijuana  . Sexual activity: Yes  Lifestyle  . Physical activity:    Days per week: Not on file    Minutes per session: Not on file  . Stress: Not on file  Relationships  . Social connections:    Talks on phone: Not on file    Gets together: Not on file    Attends religious service: Not on file    Active member of club or organization: Not on file    Attends meetings  of clubs or organizations: Not on file    Relationship status: Not on file  . Intimate partner violence:    Fear of current or ex partner: Not on file    Emotionally abused: Not on file    Physically abused: Not on file    Forced sexual activity: Not on file  Other Topics Concern  . 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   Married      ROS Review of Systems  Constitutional: Negative.   Respiratory: Negative.   Cardiovascular: Negative.   Gastrointestinal: Negative.   Neurological: Negative.   Psychiatric/Behavioral: Negative for behavioral problems, self-injury, sleep disturbance and suicidal ideas. The patient is nervous/anxious.    Objective:   Today's Vitals: BP 140/90   Pulse 75    Temp 97.9 F (36.6 C) (Oral)   Ht 5\' 11"  (1.803 m)   Wt 264 lb 6.4 oz (119.9 kg)   SpO2 97%   BMI 36.88 kg/m   Physical Exam Vitals signs reviewed.  Constitutional:      General: He is not in acute distress.    Appearance: He is obese.  HENT:     Right Ear: External ear normal.     Left Ear: External ear normal.     Mouth/Throat:     Pharynx: No oropharyngeal exudate.  Eyes:     General: No scleral icterus.    Extraocular Movements: Extraocular movements intact.     Conjunctiva/sclera: Conjunctivae normal.     Pupils: Pupils are equal, round, and reactive to light.  Neck:     Musculoskeletal: Normal range of motion and neck supple.     Thyroid: No thyromegaly.  Cardiovascular:     Rate and Rhythm: Normal rate and regular rhythm.     Heart sounds: Normal heart sounds.  Pulmonary:     Effort: Pulmonary effort is normal.     Breath sounds: Normal breath sounds.  Abdominal:     General: Bowel sounds are normal. There is no distension.     Tenderness: There is no abdominal tenderness.  Genitourinary:    Penis: Normal. No discharge.      Prostate: Normal.     Rectum: Normal.  Musculoskeletal: Normal range of motion.        General: No tenderness.  Lymphadenopathy:     Cervical: No cervical adenopathy.  Skin:    General: Skin is warm and dry.  Neurological:     Mental Status: He is alert and oriented to person, place, and time.     Cranial Nerves: No cranial nerve deficit.     Deep Tendon Reflexes: Reflexes are normal and symmetric.  Psychiatric:        Mood and Affect: Mood normal.        Behavior: Behavior normal.        Thought Content: Thought content normal.        Judgment: Judgment normal.    Assessment & Plan:   Problem List Items Addressed This Visit      Cardiovascular and Mediastinum   HTN (hypertension)    Advised again about need for DASH diet, regular exercise, and medication compliance      Relevant Medications   amLODipine (NORVASC) 10 MG  tablet   candesartan-hydrochlorothiazide (ATACAND HCT) 32-12.5 MG tablet   omega-3 acid ethyl esters (LOVAZA) 1 g  capsule   Pitavastatin Calcium 2 MG TABS   Other Relevant Orders   Basic metabolic panel (Completed)   TSH (Completed)   CBC w/Diff (Completed)     Respiratory   Sleep apnea    Positive mild OSA based on home sleep test per [facility] done 09/2017, autopap ordered, he admits to not using daily. Advised patient about importance of machine compliance         Other   Hyperglycemia   Relevant Orders   Basic metabolic panel (Completed)   Hemoglobin A1c (Completed)   Mixed dyslipidemia   Relevant Medications   omega-3 acid ethyl esters (LOVAZA) 1 g capsule   Pitavastatin Calcium 2 MG TABS   Other Relevant Orders   Hepatic function panel (Completed)   Lipid panel (Completed)   LDL cholesterol, direct (Completed)    Other Visit Diagnoses    Encounter for preventative adult health care exam with abnormal findings    -  Primary   Pure hyperglyceridemia       Relevant Medications   amLODipine (NORVASC) 10 MG tablet   candesartan-hydrochlorothiazide (ATACAND HCT) 32-12.5 MG tablet   omega-3 acid ethyl esters (LOVAZA) 1 g capsule   Pitavastatin Calcium 2 MG TABS      Outpatient Encounter Medications as of 08/17/2018  Medication Sig  . amLODipine (NORVASC) 10 MG tablet Take 1 tablet (10 mg total) by mouth daily.  . candesartan-hydrochlorothiazide (ATACAND HCT) 32-12.5 MG tablet Take 1 tablet by mouth daily.  Marland Kitchen EPINEPHrine 0.3 mg/0.3 mL IJ SOAJ injection Inject 0.3 mLs (0.3 mg total) into the muscle as needed for anaphylaxis.  Marland Kitchen omega-3 acid ethyl esters (LOVAZA) 1 g capsule Take 2 capsules (2 g total) by mouth 2 (two) times daily.  Marland Kitchen omeprazole (PRILOSEC) 20 MG capsule TAKE 1 CAPSULE BY MOUTH ONCE A DAY  . Pitavastatin Calcium 2 MG TABS Take 0.5 tablets (1 mg total) by mouth daily.  . [DISCONTINUED] amLODipine (NORVASC) 10 MG tablet Take 1 tablet (10 mg total) by mouth daily.   . [DISCONTINUED] candesartan-hydrochlorothiazide (ATACAND HCT) 32-12.5 MG tablet Take 1 tablet by mouth daily.  . [DISCONTINUED] omega-3 acid ethyl esters (LOVAZA) 1 g capsule Take 2 capsules (2 g total) by mouth 2 (two) times daily.  . [DISCONTINUED] Pitavastatin Calcium 2 MG TABS Take 0.5 tablets (1 mg total) by mouth daily.  . Multiple Vitamin (ONE-A-DAY MENS PO) Take 1 tablet by mouth.     No facility-administered encounter medications on file as of 08/17/2018.     Follow-up: Return in about 3 months (around 11/17/2018) for HTN, hyperlipidemia (fasting).   [name], NP

## 2018-08-17 NOTE — Assessment & Plan Note (Addendum)
Reports cocaine use 2-3times a week via nasal passage. Use of marijuana daily. ETOH dependence Denied need for counseling. Denies he is dependent on cocaine. States he is aware of risk of substance abuse and has no intention to use again.

## 2018-08-17 NOTE — Assessment & Plan Note (Signed)
Positive mild OSA based on home sleep test per [facility] done 09/2017, autopap ordered, he admits to not using daily. Advised patient about importance of machine compliance

## 2018-09-24 ENCOUNTER — Other Ambulatory Visit: Payer: Self-pay

## 2018-09-24 ENCOUNTER — Ambulatory Visit: Payer: Self-pay | Admitting: Nurse Practitioner

## 2018-09-24 ENCOUNTER — Ambulatory Visit (INDEPENDENT_AMBULATORY_CARE_PROVIDER_SITE_OTHER): Payer: Managed Care, Other (non HMO) | Admitting: Nurse Practitioner

## 2018-09-24 ENCOUNTER — Encounter: Payer: Self-pay | Admitting: Nurse Practitioner

## 2018-09-24 VITALS — Ht 71.0 in | Wt 260.0 lb

## 2018-09-24 DIAGNOSIS — T63441A Toxic effect of venom of bees, accidental (unintentional), initial encounter: Secondary | ICD-10-CM

## 2018-09-24 DIAGNOSIS — E781 Pure hyperglyceridemia: Secondary | ICD-10-CM

## 2018-09-24 MED ORDER — PITAVASTATIN CALCIUM 2 MG PO TABS: 0.5000 | ORAL_TABLET | Freq: Every day | ORAL | 3 refills | Status: DC

## 2018-09-24 MED ORDER — DIPHENHYDRAMINE HCL 25 MG PO CAPS: 25.0000 mg | ORAL_CAPSULE | Freq: Three times a day (TID) | ORAL | 0 refills | Status: AC | PRN

## 2018-09-24 NOTE — Telephone Encounter (Signed)
Pt. 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 Unsure what type of bee it was. Stung right side of face at his temple. Has swelling from temple down to cheek. No eye swelling. 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 Denies any shortness of breath or difficulty swallowing. Warm transfer to [facility] in the practice.  Answer Assessment - Initial Assessment Questions 1. TYPE: "What type of sting was it?" (bee, yellow jacket, etc.)      Bee 2. ONSET: "When did it occur?"      Yesterday morning 3. LOCATION: "Where is the sting located?"  "How many stings?"     Right side - temple area down to cheek 4. SWELLING SIZE: "How big is the swelling?" (inches or centimeters)      Moderate 5. REDNESS: "Is the area red or pink?" If so, ask "What size is area of redness?" (inches or cm). "When did the redness start?"     No 6. PAIN: "Is there any pain?" If so, ask: "How bad is it?"  (Scale 1-10; or mild, moderate, severe)     Moderate 7. ITCHING: "Is there any itching?" If so, ask: "How bad is it?"      No 8. RESPIRATORY DISTRESS: "Describe your breathing."     No distress 9. PRIOR REACTIONS: "Have you had any severe allergic reactions to stings in the past?" if yes, ask: "What happened?"     Yes 10. OTHER SYMPTOMS: "Do you have any other symptoms?" (e.g., face or tongue swelling, new rash elsewhere, abdominal pain, vomiting)       No 11. PREGNANCY: "Is there any chance you are pregnant?" "When was your last menstrual period?"       n/a  Protocols used: BEE OR YELLOW JACKET STING-A-AH

## 2018-09-24 NOTE — Patient Instructions (Signed)
No need for Epipen at this time. Take baenadryl as discussed. Use cold compress 2-3times a day (78mns at a time)  Bee, Wasp, or [employer] Adult Bees, wasps, and hornets are part of a family of insects that can sting people. These stings can cause pain and inflammation, but they are usually not serious. However, some people may have an allergic reaction to a sting. This can cause the symptoms to be more severe. What increases the risk? You may be at a greater risk of getting stung if you:  Provoke a stinging insect by swatting or disturbing it.  Wear strong-smelling soaps, deodorants, or body sprays.  Spend time outdoors near gardens with flowers or fruit trees or in clothes that expose skin.  Eat or drink outside. What are the signs or symptoms? Common symptoms of this condition include:  A red lump in the skin that sometimes has a tiny hole in the center. In some cases, a stinger may be in the center of the wound.  Pain and itching at the sting site.  Redness and swelling around the sting site. If you have an allergic reaction (localized allergic reaction), the swelling and redness may spread out from the sting site. In some cases, this reaction can continue to develop over the next 24-48 hours. In rare cases, a person may have a severe allergic reaction (anaphylactic reaction) to a sting. Symptoms of an anaphylactic reaction may include:  Wheezing or difficulty breathing.  Raised, itchy, red patches on the skin (hives).  Nausea or vomiting.  Abdominal cramping.  Diarrhea.  Tightness in the chest or chest pain.  Dizziness or fainting.  Redness of the face (flushing).  Hoarse voice.  Swollen tongue, lips, or face. How is this diagnosed? This condition is usually diagnosed based on your symptoms and medical history as well as a physical exam. You may have an allergy test to determine if you are allergic to the substance that the insect injected during the sting  (venom). How is this treated? If you were stung by a bee, the stinger and a small sac of venom may be in the wound. It is important to remove the stinger as soon as possible. You can do this by brushing across the wound with gauze, a fingernail, or a flat card such as a credit card. Removing the stinger can help reduce the severity of your bodys reaction to the sting. Most stings can be treated with:  Icing to reduce swelling in the area.  Medicines (antihistamines) to treat itching or an allergic reaction.  Medicines to help reduce pain. These may be medicines that you take by mouth, or medicated creams or lotions that you apply to your skin. Pay close attention to your symptoms after you have been stung. If possible, have someone stay with you to make sure you do not have an allergic reaction. If you have any signs of an allergic reaction, call your health care provider. If you have ever had a severe allergic reaction, your health care provider may give you an inhaler or injectable medicine (epinephrine auto-injector) to use if necessary. Follow these instructions at home:   Wash the sting site 2-3 times each day with soap and water as told by your health care provider.  Apply or take over-the-counter and prescription medicines only as told by your health care provider.  If directed, apply ice to the sting area. ? Put ice in a plastic bag. ? Place a towel between your skin and the  bag. ? Leave the ice on for 20 minutes, 2-3 times a day.  Do not scratch the sting area.  If you had a severe allergic reaction to a sting, you may need: ? To wear a medical bracelet or necklace that lists the allergy. ? To learn when and how to use an anaphylaxis kit or epinephrine injection. Your family members and coworkers may also need to learn this. ? To carry an anaphylaxis kit or epinephrine injection with you at all times. How is this prevented?  Avoid swatting at stinging insects and disturbing  insect nests.  Do not use fragrant soaps or lotions.  Wear shoes, pants, and long sleeves when spending time outdoors, especially in grassy areas where stinging insects are common.  Keep outdoor areas free from nests or hives.  Keep food and drink containers covered when eating outdoors.  Avoid working or sitting near [employer], if possible.  Wear gloves if you are gardening or working outdoors.  If an attack by a stinging insect or a swarm seems likely in the moment, move away from the area or find a barrier between you and the insect(s), such as a door. Contact a health care provider if:  Your symptoms do not get better in 2-3 days.  You have redness, swelling, or pain that spreads beyond the area of the sting.  You have a fever. Get help right away if: You have symptoms of a severe allergic reaction. These include:  Wheezing or difficulty breathing.  Tightness in the chest or chest pain.  Light-headedness or fainting.  Itchy, raised, red patches on the skin.  Nausea or vomiting.  Abdominal cramping.  Diarrhea.  A swollen tongue or lips, or trouble swallowing.  Dizziness or fainting. Summary  Stings from bees, wasps, and hornets can cause pain and inflammation, but they are usually not serious. However, some people may have an allergic reaction to a sting. This can cause the symptoms to be more severe.  Pay close attention to your symptoms after you have been stung. If possible, have someone stay with you to make sure you do not have an allergic reaction.  Call your health care provider if you have any signs of an allergic reaction. This information is not intended to replace advice given to you by your health care provider. Make sure you discuss any questions you have with your health care provider. Document Released: 03/14/2005 Document Revised: 03/09/2017 Document Reviewed: 05/19/2016 Elsevier Patient Education  2020 [employer].

## 2018-09-24 NOTE — Progress Notes (Signed)
Virtual Visit via Video Note  I connected with Patrick Levine on 09/24/18 at  9:30 AM EDT by a video enabled telemedicine application and verified that I am speaking with the correct person using two identifiers.  Location: Patient: Home Provider: Office   I discussed the limitations of evaluation and management by telemedicine and the availability of in person appointments. The patient expressed understanding and agreed to proceed.  CC: bee sting yesterday on face, did not use Epipen nor bendaryl.  History of Present Illness: Allergic Reaction This is a new problem. The current episode started yesterday. The problem is unchanged. The problem is mild. The patient was exposed to insect bite. The time of exposure was just prior to onset. The exposure occurred at home. Associated symptoms include itching and a rash. Pertinent negatives include no abdominal pain, chest pain, chest pressure, coughing, diarrhea, difficulty breathing, drooling, eye itching, eye redness, eye watering, globus sensation, hyperventilation, stridor, trouble swallowing, vomiting or wheezing. Swelling is present on the face (right side of face). Past treatments include nothing.   Observations/Objective: Physical Exam  Constitutional: He is oriented to person, place, and time. No distress.  HENT:  Head:    Right Ear: External ear normal.  Left Ear: External ear normal.  Nose: Nose normal.  Eyes: Conjunctivae and EOM are normal.  Neck: Normal range of motion. Neck supple.  Pulmonary/Chest: Effort normal.  Neurological: He is alert and oriented to person, place, and time.  Psychiatric: He has a normal mood and affect. His behavior is normal. Thought content normal.   Assessment and Plan: Patrick Levine was seen today for insect bite.  Diagnoses and all orders for this visit:  Bee sting, accidental or unintentional, initial encounter -     diphenhydrAMINE (BENADRYL) 25 mg capsule; Take 1 capsule (25 mg total) by  mouth every 8 (eight) hours as needed.  Pure hyperglyceridemia -     Pitavastatin Calcium 2 MG TABS; Take 0.5 tablets (1 mg total) by mouth daily.    Follow Up Instructions: No need for Epipen at this time. Take baenadryl as discussed. Use cold compress 2-3times a day (58mins at a time)   I discussed the assessment and treatment plan with the patient. The patient was provided an opportunity to ask questions and all were answered. The patient agreed with the plan and demonstrated an understanding of the instructions.   The patient was advised to call back or seek an in-person evaluation if the symptoms worsen or if the condition fails to improve as anticipated.   Patrick Lacy, NP

## 2018-11-09 ENCOUNTER — Other Ambulatory Visit: Payer: Self-pay | Admitting: Nurse Practitioner

## 2018-11-09 DIAGNOSIS — J040 Acute laryngitis: Secondary | ICD-10-CM

## 2018-11-09 DIAGNOSIS — K219 Gastro-esophageal reflux disease without esophagitis: Secondary | ICD-10-CM

## 2018-11-09 MED FILL — CANDESARTAN CILEXETIL-HCTZ: 32-12.5 | 90 days supply | Qty: 90 | Fill #1

## 2018-11-09 MED FILL — AMLODIPINE BESYLATE 10 MG T: 10 | 90 days supply | Qty: 90 | Fill #1

## 2018-11-09 MED FILL — LIVALO 2 MG TABLET: 2 | 90 days supply | Qty: 45 | Fill #1

## 2018-11-09 MED FILL — OMEGA-3 ETHYL ESTERS 1 GM C: 1 | 30 days supply | Qty: 120 | Fill #1

## 2018-11-12 ENCOUNTER — Telehealth: Payer: Self-pay | Admitting: Nurse Practitioner

## 2018-11-12 MED FILL — OMEPRAZOLE 20 MG CAP: 20 | 90 days supply | Qty: 90 | Fill #0

## 2018-11-12 NOTE — Telephone Encounter (Signed)

## 2018-11-13 ENCOUNTER — Other Ambulatory Visit: Payer: Self-pay

## 2018-11-13 ENCOUNTER — Encounter: Payer: Self-pay | Admitting: Nurse Practitioner

## 2018-11-13 ENCOUNTER — Ambulatory Visit (INDEPENDENT_AMBULATORY_CARE_PROVIDER_SITE_OTHER): Payer: Managed Care, Other (non HMO) | Admitting: Nurse Practitioner

## 2018-11-13 ENCOUNTER — Telehealth: Payer: Self-pay

## 2018-11-13 VITALS — BP 142/98 | HR 74 | Temp 97.1°F | Ht 71.0 in | Wt 261.2 lb

## 2018-11-13 DIAGNOSIS — I10 Essential (primary) hypertension: Secondary | ICD-10-CM | POA: Diagnosis not present

## 2018-11-13 DIAGNOSIS — E782 Mixed hyperlipidemia: Secondary | ICD-10-CM

## 2018-11-13 LAB — BASIC METABOLIC PANEL
BUN: 11 mg/dL (ref 6–23)
CO2: 27 mEq/L (ref 19–32)
Calcium: 9.9 mg/dL (ref 8.4–10.5)
Chloride: 103 mEq/L (ref 96–112)
Creatinine, Ser: 0.89 mg/dL (ref 0.40–1.50)
GFR: 112.57 mL/min (ref >60.00)
Glucose, Bld: 116 mg/dL — ABNORMAL HIGH (ref 70–99)
Potassium: 3.8 mEq/L (ref 3.5–5.1)
Sodium: 138 mEq/L (ref 135–145)

## 2018-11-13 LAB — LIPID PANEL
Cholesterol: 242 mg/dL — ABNORMAL HIGH (ref 0–200)
HDL: 46.7 mg/dL (ref >39.00)
NonHDL: 195.78
Total CHOL/HDL Ratio: 5
Triglycerides: 331 mg/dL — ABNORMAL HIGH (ref 0.0–149.0)
VLDL: 66.2 mg/dL — ABNORMAL HIGH (ref 0.0–40.0)

## 2018-11-13 LAB — LDL CHOLESTEROL, DIRECT: Direct LDL: 150 mg/dL

## 2018-11-13 MED ORDER — CANDESARTAN CILEXETIL-HCTZ 32-12.5 MG PO TABS: 1.0000 | ORAL_TABLET | Freq: Every day | ORAL | 1 refills | Status: DC

## 2018-11-13 MED ORDER — ATENOLOL 25 MG PO TABS: 12.5000 mg | ORAL_TABLET | Freq: Two times a day (BID) | ORAL | 5 refills | Status: DC

## 2018-11-13 MED FILL — ATENOLOL 25 MG TABLET: 25 | 30 days supply | Qty: 30 | Fill #0

## 2018-11-13 NOTE — Patient Instructions (Addendum)
Schedule appt with neurology for CPAP machine f/up It is important to be compliant with CPAP use in order to avoid complications like dementia, cardiovascular disease, and diabetes.  No improvement in lipid panel and glucose control.  You need to schedule about appt with cardiology to discuss change if dyslipidemia management. You need to schedule appt with neurology for CPAP management. As discussed earlier, you have a high risk of developing diabetes and cardiovascular disease (13.5%) over the next 3yrs. Compliance with CPAP and medication, and lifestyle modifications are imperative to decrease your risk (DASH diet, minimize alcohol consumption, Stop tobacco use, moderate exercise 3-4days/week-30mins) Your BP goal is <140/90, therefore maintain current medications and add atenolol 12.5mg  BID F/up in 10month instead of 79months   DASH Eating Plan DASH stands for "Dietary Approaches to Stop Hypertension." The DASH eating plan is a healthy eating plan that has been shown to reduce high blood pressure (hypertension). It may also reduce your risk for type 2 diabetes, heart disease, and stroke. The DASH eating plan may also help with weight loss. What are tips for following this plan?  General guidelines  Avoid eating more than 2,300 mg (milligrams) of salt (sodium) a day. If you have hypertension, you may need to reduce your sodium intake to 1,500 mg a day.  Limit alcohol intake to no more than 1 drink a day for nonpregnant women and 2 drinks a day for men. One drink equals 12 oz of beer, 5 oz of wine, or 1 oz of hard liquor.  Work with your health care provider to maintain a healthy body weight or to lose weight. Ask what an ideal weight is for you.  Get at least 30 minutes of exercise that causes your heart to beat faster (aerobic exercise) most days of the week. Activities may include walking, swimming, or biking.  Work with your health care provider or diet and nutrition specialist  (dietitian) to adjust your eating plan to your individual calorie needs. Reading food labels   Check food labels for the amount of sodium per serving. Choose foods with less than 5 percent of the Daily Value of sodium. Generally, foods with less than 300 mg of sodium per serving fit into this eating plan.  To find whole grains, look for the word "whole" as the first word in the ingredient list. Shopping  Buy products labeled as "low-sodium" or "no salt added."  Buy fresh foods. Avoid canned foods and premade or frozen meals. Cooking  Avoid adding salt when cooking. Use salt-free seasonings or herbs instead of table salt or sea salt. Check with your health care provider or pharmacist before using salt substitutes.  Do not fry foods. Cook foods using healthy methods such as baking, boiling, grilling, and broiling instead.  Cook with heart-healthy oils, such as olive, canola, soybean, or sunflower oil. Meal planning  Eat a balanced diet that includes: ? 5 or more servings of fruits and vegetables each day. At each meal, try to fill half of your plate with fruits and vegetables. ? Up to 6-8 servings of whole grains each day. ? Less than 6 oz of lean meat, poultry, or fish each day. A 3-oz serving of meat is about the same size as a deck of cards. One egg equals 1 oz. ? 2 servings of low-fat dairy each day. ? A serving of nuts, seeds, or beans 5 times each week. ? Heart-healthy fats. Healthy fats called Omega-3 fatty acids are found in foods such as flaxseeds and  coldwater fish, like sardines, salmon, and mackerel.  Limit how much you eat of the following: ? Canned or prepackaged foods. ? Food that is high in trans fat, such as fried foods. ? Food that is high in saturated fat, such as fatty meat. ? Sweets, desserts, sugary drinks, and other foods with added sugar. ? Full-fat dairy products.  Do not salt foods before eating.  Try to eat at least 2 vegetarian meals each week.  Eat  more home-cooked food and less restaurant, buffet, and fast food.  When eating at a restaurant, ask that your food be prepared with less salt or no salt, if possible. What foods are recommended? The items listed may not be a complete list. Talk with your dietitian about what dietary choices are best for you. Grains Whole-grain or whole-wheat bread. Whole-grain or whole-wheat pasta. Brown rice. [place]. Bulgur. Whole-grain and low-sodium cereals. Pita bread. Low-fat, low-sodium crackers. Whole-wheat flour tortillas. Vegetables Fresh or frozen vegetables (raw, steamed, roasted, or grilled). Low-sodium or reduced-sodium tomato and vegetable juice. Low-sodium or reduced-sodium tomato sauce and tomato paste. Low-sodium or reduced-sodium canned vegetables. Fruits All fresh, dried, or frozen fruit. Canned fruit in natural juice (without added sugar). Meat and other protein foods Skinless chicken or [place]. Ground chicken or [place]. Pork with fat trimmed off. Fish and seafood. Egg whites. Dried beans, peas, or lentils. Unsalted nuts, nut butters, and seeds. Unsalted canned beans. Lean cuts of beef with fat trimmed off. Low-sodium, lean deli meat. Dairy Low-fat (1%) or fat-free (skim) milk. Fat-free, low-fat, or reduced-fat cheeses. Nonfat, low-sodium ricotta or cottage cheese. Low-fat or nonfat yogurt. Low-fat, low-sodium cheese. Fats and oils Soft margarine without trans fats. Vegetable oil. Low-fat, reduced-fat, or light mayonnaise and salad dressings (reduced-sodium). Canola, safflower, olive, soybean, and sunflower oils. Avocado. Seasoning and other foods Herbs. Spices. Seasoning mixes without salt. Unsalted popcorn and pretzels. Fat-free sweets. What foods are not recommended? The items listed may not be a complete list. Talk with your dietitian about what dietary choices are best for you. Grains Baked goods made with fat, such as croissants, muffins, or some breads. Dry pasta or rice meal  packs. Vegetables Creamed or fried vegetables. Vegetables in a cheese sauce. Regular canned vegetables (not low-sodium or reduced-sodium). Regular canned tomato sauce and paste (not low-sodium or reduced-sodium). Regular tomato and vegetable juice (not low-sodium or reduced-sodium). Angie Fava. Olives. Fruits Canned fruit in a light or heavy syrup. Fried fruit. Fruit in cream or butter sauce. Meat and other protein foods Fatty cuts of meat. Ribs. Fried meat. Berniece Salines. Sausage. Bologna and other processed lunch meats. Salami. Fatback. Hotdogs. Bratwurst. Salted nuts and seeds. Canned beans with added salt. Canned or smoked fish. Whole eggs or egg yolks. Chicken or [place] with skin. Dairy Whole or 2% milk, cream, and half-and-half. Whole or full-fat cream cheese. Whole-fat or sweetened yogurt. Full-fat cheese. Nondairy creamers. Whipped toppings. Processed cheese and cheese spreads. Fats and oils Butter. Stick margarine. Lard. Shortening. Ghee. Bacon fat. Tropical oils, such as coconut, palm kernel, or palm oil. Seasoning and other foods Salted popcorn and pretzels. Onion salt, garlic salt, seasoned salt, table salt, and sea salt. Worcestershire sauce. Tartar sauce. Barbecue sauce. Teriyaki sauce. Soy sauce, including reduced-sodium. Steak sauce. Canned and packaged gravies. Fish sauce. Oyster sauce. Cocktail sauce. Horseradish that you find on the shelf. Ketchup. Mustard. Meat flavorings and tenderizers. Bouillon cubes. Hot sauce and Tabasco sauce. Premade or packaged marinades. Premade or packaged taco seasonings. Relishes. Regular salad dressings. Where to find  more information:  National Heart, Lung, and Blood Institute: https://wilson-eaton.com/  American Heart Association: www.heart.org Summary  The DASH eating plan is a healthy eating plan that has been shown to reduce high blood pressure (hypertension). It may also reduce your risk for type 2 diabetes, heart disease, and stroke.  With the DASH eating  plan, you should limit salt (sodium) intake to 2,300 mg a day. If you have hypertension, you may need to reduce your sodium intake to 1,500 mg a day.  When on the DASH eating plan, aim to eat more fresh fruits and vegetables, whole grains, lean proteins, low-fat dairy, and heart-healthy fats.  Work with your health care provider or diet and nutrition specialist (dietitian) to adjust your eating plan to your individual calorie needs. This information is not intended to replace advice given to you by your health care provider. Make sure you discuss any questions you have with your health care provider. Document Released: 03/03/2011 Document Revised: 02/24/2017 Document Reviewed: 03/07/2016 Elsevier Patient Education  2020 [employer].

## 2018-11-13 NOTE — Telephone Encounter (Signed)
Received this notice from [facility]: "Setup date: 11/16/17 Pick up issued: 02/15/18 (non compliant and could not obtain reauth) Returned: 11/08/18"

## 2018-11-13 NOTE — Progress Notes (Signed)
Subjective:  Patient ID: Patrick Levine, male    DOB: [birth year]  Age: 43 y.o. MRN: [id number]  CC: Follow-up (follow up BP--gout on finger--going on for while and mole consult fasting--)  HPI  HTN: BP not at goal with atacand/hctz and amlodipine Admits to not being compliant with CPAP, use 2-3x/week. Continuous tobacco use. BP Readings from Last 3 Encounters:  11/13/18 (!) 142/98  08/17/18 140/90  07/16/18 (!) 144/92  does not follow DASH diet.  Hypertriglyceride: Current use of lovaza and pitavastatin. No myalgia or joint pain. Did not f/up with cardiology at instructed, last seen 09/2017 Lipid Panel     Component Value Date/Time   CHOL 242 (H) 11/13/2018 0953   TRIG 331.0 (H) 11/13/2018 0953   HDL 46.70 11/13/2018 0953   CHOLHDL 5 11/13/2018 0953   VLDL 66.2 (H) 11/13/2018 0953   LDLDIRECT 150.0 11/13/2018 0953    Reviewed past Medical, Social and Family history today.  Outpatient Medications Prior to Visit  Medication Sig Dispense Refill   amLODipine (NORVASC) 10 MG tablet Take 1 tablet (10 mg total) by mouth daily. 90 tablet 3   diphenhydrAMINE (BENADRYL) 25 mg capsule Take 1 capsule (25 mg total) by mouth every 8 (eight) hours as needed. 30 capsule 0   EPINEPHrine 0.3 mg/0.3 mL IJ SOAJ injection Inject 0.3 mLs (0.3 mg total) into the muscle as needed for anaphylaxis. 1 Device 1   Multiple Vitamin (ONE-A-DAY MENS PO) Take 1 tablet by mouth.       omega-3 acid ethyl esters (LOVAZA) 1 g capsule Take 2 capsules (2 g total) by mouth 2 (two) times daily. 120 capsule 11   omeprazole (PRILOSEC) 20 MG capsule TAKE 1 CAPSULE BY MOUTH ONCE A DAY 90 capsule 0   Pitavastatin Calcium 2 MG TABS Take 0.5 tablets (1 mg total) by mouth daily. 45 tablet 3   candesartan-hydrochlorothiazide (ATACAND HCT) 32-12.5 MG tablet Take 1 tablet by mouth daily. 90 tablet 1   No facility-administered medications prior to visit.     ROS See HPI  Objective:  BP (!) 142/98     Pulse 74    Temp (!) 97.1 F (36.2 C) (Tympanic)    Ht 5\' 11"  (1.803 m)    Wt 261 lb 3.2 oz (118.5 kg)    SpO2 97%    BMI 36.43 kg/m   BP Readings from Last 3 Encounters:  11/13/18 (!) 142/98  08/17/18 140/90  07/16/18 (!) 144/92    Wt Readings from Last 3 Encounters:  11/13/18 261 lb 3.2 oz (118.5 kg)  09/24/18 260 lb (117.9 kg)  08/17/18 264 lb 6.4 oz (119.9 kg)    Physical Exam Vitals signs reviewed.  Cardiovascular:     Rate and Rhythm: Normal rate and regular rhythm.     Pulses: Normal pulses.     Heart sounds: Normal heart sounds.  Musculoskeletal:     Right lower leg: No edema.     Left lower leg: No edema.  Neurological:     Mental Status: He is alert and oriented to person, place, and time.    Lab Results  Component Value Date   WBC 6.1 08/17/2018   HGB 14.9 08/17/2018   HCT 43.9 08/17/2018   PLT 309.0 08/17/2018   GLUCOSE 116 (H) 11/13/2018   CHOL 242 (H) 11/13/2018   TRIG 331.0 (H) 11/13/2018   HDL 46.70 11/13/2018   LDLDIRECT 150.0 11/13/2018   ALT 23 08/17/2018   AST 18 08/17/2018   NA 138  11/13/2018   K 3.8 11/13/2018   CL 103 11/13/2018   CREATININE 0.89 11/13/2018   BUN 11 11/13/2018   CO2 27 11/13/2018   TSH 1.17 08/17/2018   HGBA1C 6.1 08/17/2018    Dg Chest 2 View  Result Date: 09/11/2017 CLINICAL DATA:  Left-sided chest pain for 5-6 days EXAM: CHEST - 2 VIEW COMPARISON:  05/03/2016 FINDINGS: Normal heart size and mediastinal contours. No acute infiltrate or edema. No effusion or pneumothorax. No acute osseous findings. IMPRESSION: Negative chest. Electronically Signed   By: Monte Fantasia M.D.   On: 09/11/2017 09:31   Ct Angio Chest Pe W And/or Wo Contrast  Result Date: 09/11/2017 CLINICAL DATA:  Chest pain and shortness of breath EXAM: CT ANGIOGRAPHY CHEST WITH CONTRAST TECHNIQUE: Multidetector CT imaging of the chest was performed using the standard protocol during bolus administration of intravenous contrast. Multiplanar CT image  reconstructions and MIPs were obtained to evaluate the vascular anatomy. CONTRAST:  150mL ISOVUE-370 IOPAMIDOL (ISOVUE-370) INJECTION 76% COMPARISON:  Chest radiograph December 20, 2011; chest radiograph September 11, 2017 FINDINGS: Cardiovascular: There is no demonstrable pulmonary embolus. There is no thoracic aortic aneurysm or dissection. Visualized great vessels appear unremarkable. No pericardial effusion or pericardial thickening evident. Mediastinum/Nodes: Thyroid appears unremarkable. There is no appreciable thoracic adenopathy. No esophageal lesions are appreciable. Lungs/Pleura: Lungs are clear. No pleural effusion or pleural thickening evident. Upper Abdomen: Visualized upper abdominal structures appear unremarkable. Musculoskeletal: There are no blastic or lytic bone lesions. No evident chest wall lesion. Review of the MIP images confirms the above findings. IMPRESSION: 1. No demonstrable pulmonary embolus. No thoracic aortic aneurysm or dissection. 2.  Lungs clear. 3.  No demonstrable thoracic adenopathy. Electronically Signed   By: Lowella Grip III M.D.   On: 09/11/2017 10:49   US Venous Img Lower Bilateral  Result Date: 09/11/2017 CLINICAL DATA:  Bilateral lower extremity pain and edema since Friday. Recent car trip. Evaluate for DVT. EXAM: BILATERAL LOWER EXTREMITY VENOUS DOPPLER ULTRASOUND TECHNIQUE: Gray-scale sonography with graded compression, as well as color Doppler and duplex ultrasound were performed to evaluate the lower extremity deep venous systems from the level of the common femoral vein and including the common femoral, femoral, profunda femoral, popliteal and calf veins including the posterior tibial, peroneal and gastrocnemius veins when visible. The superficial great saphenous vein was also interrogated. Spectral Doppler was utilized to evaluate flow at rest and with distal augmentation maneuvers in the common femoral, femoral and popliteal veins. COMPARISON:  None. FINDINGS:  RIGHT LOWER EXTREMITY Common Femoral Vein: No evidence of thrombus. Normal compressibility, respiratory phasicity and response to augmentation. Saphenofemoral Junction: No evidence of thrombus. Normal compressibility and flow on color Doppler imaging. Profunda Femoral Vein: No evidence of thrombus. Normal compressibility and flow on color Doppler imaging. Femoral Vein: No evidence of thrombus. Normal compressibility, respiratory phasicity and response to augmentation. Popliteal Vein: No evidence of thrombus. Normal compressibility, respiratory phasicity and response to augmentation. Calf Veins: No evidence of thrombus. Normal compressibility and flow on color Doppler imaging. Superficial Great Saphenous Vein: No evidence of thrombus. Normal compressibility. Venous Reflux:  None. Other Findings:  None. LEFT LOWER EXTREMITY Common Femoral Vein: No evidence of thrombus. Normal compressibility, respiratory phasicity and response to augmentation. Saphenofemoral Junction: No evidence of thrombus. Normal compressibility and flow on color Doppler imaging. Profunda Femoral Vein: No evidence of thrombus. Normal compressibility and flow on color Doppler imaging. Femoral Vein: No evidence of thrombus. Normal compressibility, respiratory phasicity and response to augmentation. Popliteal Vein: No  evidence of thrombus. Normal compressibility, respiratory phasicity and response to augmentation. Calf Veins: No evidence of thrombus. Normal compressibility and flow on color Doppler imaging. Superficial Great Saphenous Vein: No evidence of thrombus. Normal compressibility. Venous Reflux:  None. Other Findings:  None. IMPRESSION: No evidence of DVT within either lower extremity. Electronically Signed   By: Sandi Mariscal M.D.   On: 09/11/2017 11:42    Assessment & Plan:   Elizandro was seen today for follow-up.  Diagnoses and all orders for this visit:  Essential hypertension -     Basic metabolic panel -     atenolol (TENORMIN) 25  MG tablet; Take 0.5 tablets (12.5 mg total) by mouth 2 (two) times daily. -     candesartan-hydrochlorothiazide (ATACAND HCT) 32-12.5 MG tablet; Take 1 tablet by mouth daily.  Mixed dyslipidemia -     Lipid panel  Other orders -     LDL cholesterol, direct   I am having Patrick Levine start on atenolol. I am also having him maintain his Multiple Vitamin (ONE-A-DAY MENS PO), EPINEPHrine, amLODipine, omega-3 acid ethyl esters, Pitavastatin Calcium, diphenhydrAMINE, omeprazole, and candesartan-hydrochlorothiazide.  Meds ordered this encounter  Medications   atenolol (TENORMIN) 25 MG tablet    Sig: Take 0.5 tablets (12.5 mg total) by mouth 2 (two) times daily.    Dispense:  30 tablet    Refill:  5    Order Specific Question:   Supervising Provider    Answer:   Lucille Passy [[id number]]   candesartan-hydrochlorothiazide (ATACAND HCT) 32-12.5 MG tablet    Sig: Take 1 tablet by mouth daily.    Dispense:  90 tablet    Refill:  1    Order Specific Question:   Supervising Provider    Answer:   Lucille Passy [[id number]]    Problem List Items Addressed This Visit      Cardiovascular and Mediastinum   HTN (hypertension) - Primary    BP not at goal with atacand/hctz and amlodipine Admits to not being compliant with CPAP, use 2-3x/week. Continuous tobacco use. BP Readings from Last 3 Encounters:  11/13/18 (!) 142/98  08/17/18 140/90  07/16/18 (!) 144/92  does not follow DASH diet.  Add atenolol 12.5mg  BID      Relevant Medications   atenolol (TENORMIN) 25 MG tablet   candesartan-hydrochlorothiazide (ATACAND HCT) 32-12.5 MG tablet   Other Relevant Orders   Basic metabolic panel (Completed)     Other   Mixed dyslipidemia   Relevant Orders   Lipid panel (Completed)      Follow-up: Return in about 3 months (around 02/13/2019) for DM and HTN, hyperlipidemia.  [name], NP

## 2018-11-13 NOTE — Assessment & Plan Note (Signed)
BP not at goal with atacand/hctz and amlodipine Admits to not being compliant with CPAP, use 2-3x/week. Continuous tobacco use. BP Readings from Last 3 Encounters:  11/13/18 (!) 142/98  08/17/18 140/90  07/16/18 (!) 144/92  does not follow DASH diet.  Add atenolol 12.5mg  BID

## 2018-11-13 NOTE — Telephone Encounter (Signed)
I called pt to discuss. No answer, left a message asking him to call me back. 

## 2018-11-21 NOTE — Telephone Encounter (Signed)
I called pt again to discuss. No answer, left a message asking him to call me back. 

## 2018-11-29 NOTE — Telephone Encounter (Signed)
I called pt. He reports that he bought a cpap from a family member. He needs it set to the correct pressure. I will ask [facility] to help with this. Pt is also overdue for his yearly osa follow up. Pt is agreeable to an appt with Amy, NP on 12/05/18 at 8:30am. Pt verbalized understanding of new appt date and time. I have asked [facility] for help with pt's new machine.

## 2018-12-04 NOTE — Progress Notes (Addendum)
PATIENT: Patrick Levine DOB: [birth year]  REASON FOR VISIT: follow up HISTORY FROM: patient  Chief Complaint  Patient presents with   Follow-up    Yearly f/u. Alone. New room. Patient mentioned that he is waking his self up at night with chest pains and shortness of breath.      HISTORY OF PRESENT ILLNESS: Today 12/05/18 [place] Drumm is a 43 y.o. male here today for follow up of OSA on CPAP. He purchased CPAP from a family member.  He has been unable to have CPAP set up due to needing an office visit with [place].  He states that he is ready to use CPAP therapy.  He is waking himself up at night with generalized chest pains and gasping for air. He is followed closely bu [name], NP (PCP) and has seen Dr Elberta Spaniel (cardiology) for concerns of chest pain. He reports that workup was benign and he was told he needed to restart CPAP therapy and manage BP.  He understands the risk of untreated sleep apnea and is ready to start therapy.   HISTORY: (copied from Dr Guadelupe Sabin note on 08/08/2017)  Dear Baldo Ash,   I saw your patient, Patrick Levine, upon your kind request, in my neurologic clinic today for initial consultation of his sleep disorder, in particular, concern for underlying obstructive sleep apnea. The patient is unaccompanied today. As you know, Patrick Levine, is a 43 year old right-handed gentleman with an underlying medical history of hypertension, hyperlipidemia, smoking, reflux disease and morbid obesity with a BMI of over 40, who reports snoring and excessive daytime somnolence. As well as witnessed apneic breathing pauses while asleep per wifes report. He has woken up with a sense of gasping for air when he sleeps on his back. I reviewed your office note from 06/21/2017. His Epworth sleepiness score is 19 out of 24, fatigue score is 10 out of 63. He is married and lives with his wife, son and grandson. He smokes one pack every 3 weeks or so, drinks alcohol in  the form of beer, 12 pack per week on average. 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 He is not aware of any family history of OSA. He is supposed to see ENT for his laryngitis/hoarseness.  He does endorse nocturia which is about twice per average night, denies morning headaches, endorses occasional restless leg symptoms. His bedtime is generally around 9:30 or 10, he is typically asleep by 10:30. Rise time is around 6. He has 4 children, 2 daughters and 2 sons, his youngest son age 47 lives with them and his 41-year-old grandson who is his daughter's son. His work schedule is typically from 11 AM to 7 PM. He does not work nights.   REVIEW OF SYSTEMS: Out of a complete 14 system review of symptoms, the patient complains only of the following symptoms, sleep apnea and all other reviewed systems are negative.   ALLERGIES: Allergies  Allergen Reactions   Bee Venom Anaphylaxis   Pravastatin Sodium Other (See Comments)    Muscle and joint aches    HOME MEDICATIONS: Outpatient Medications Prior to Visit  Medication Sig Dispense Refill   amLODipine (NORVASC) 10 MG tablet Take 1 tablet (10 mg total) by mouth daily. 90 tablet 3   atenolol (TENORMIN) 25 MG tablet Take 0.5 tablets (12.5 mg total) by mouth 2 (two) times daily. 30 tablet 5   candesartan-hydrochlorothiazide (ATACAND HCT) 32-12.5 MG tablet Take 1 tablet by mouth daily.  90 tablet 1   diphenhydrAMINE (BENADRYL) 25 mg capsule Take 1 capsule (25 mg total) by mouth every 8 (eight) hours as needed. 30 capsule 0   EPINEPHrine 0.3 mg/0.3 mL IJ SOAJ injection Inject 0.3 mLs (0.3 mg total) into the muscle as needed for anaphylaxis. 1 Device 1   Multiple Vitamin (ONE-A-DAY MENS PO) Take 1 tablet by mouth.       omega-3 acid ethyl esters (LOVAZA) 1 g capsule Take 2 capsules (2 g total) by mouth 2 (two) times daily. 120 capsule 11   omeprazole (PRILOSEC) 20 MG capsule TAKE 1 CAPSULE BY MOUTH  ONCE A DAY 90 capsule 0   Pitavastatin Calcium 2 MG TABS Take 0.5 tablets (1 mg total) by mouth daily. 45 tablet 3   No facility-administered medications prior to visit.     PAST MEDICAL HISTORY: Past Medical History:  Diagnosis Date   Alcohol addiction ([place])    High cholesterol    Hypertension     PAST SURGICAL HISTORY: Past Surgical History:  Procedure Laterality Date   LACERATION REPAIR  2001   lip laceration--stitches.    FAMILY HISTORY: Family History  Problem Relation Age of Onset   Hypertension Father    Hypertension Paternal Grandfather     SOCIAL HISTORY: Social History   Socioeconomic History   Marital status: Married    Spouse name: Not on file   Number of children: 4   Years of education: Not on file   Highest education level: Not on file  Occupational History   Occupation: MENTAL HEALTH/CASE MANAGER    Employer: [employer]  Social Needs   Financial resource strain: Not on file   Food insecurity    Worry: Not on file    Inability: Not on file   Transportation needs    Medical: Not on file    Non-medical: Not on file  Tobacco Use   Smoking status: Current Some Day Smoker    Packs/day: 0.50    Types: Cigarettes   Smokeless tobacco: Never Used  Substance and Sexual Activity   Alcohol use: Yes    Alcohol/week: 8.0 standard drinks    Types: 5 Cans of beer, 3 Shots of liquor per week    Comment: 6-8 drinks/day   Drug use: Yes    Frequency: 3.0 times per week    Types: "Crack" cocaine, Marijuana    Comment:  use of cocaine and marijuana   Sexual activity: Yes  Lifestyle   Physical activity    Days per week: Not on file    Minutes per session: Not on file   Stress: Not on file  Relationships   Social connections    Talks on phone: Not on file    Gets together: Not on file    Attends religious service: Not on file    Active member of club or organization: Not on file    Attends meetings of clubs or  organizations: Not on file    Relationship status: Not on file   Intimate partner violence    Fear of current or ex partner: Not on file    Emotionally abused: Not on file    Physically abused: Not on file    Forced sexual activity: Not on file  Other Topics Concern   Not on file  Social History Narrative   3 biological children   1 step daughter   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  Married         PHYSICAL EXAM  Vitals:   12/05/18 0842  BP: 131/78  Pulse: 66  Temp: 98 F (36.7 C)  TempSrc: Oral  Weight: 264 lb 6.4 oz (119.9 kg)  Height: 5\' 6"  (1.676 m)   Body mass index is 42.68 kg/m.  Generalized: Well developed, in no acute distress  Cardiology: normal rate and rhythm, no murmur noted Neurological examination  Mentation: Alert oriented to time, place, history taking. Follows all commands speech and language fluent Cranial nerve II-XII: Pupils were equal round reactive to light. Extraocular movements were full, visual field were full on confrontational test. Facial sensation and strength were normal. Uvula tongue midline. Head turning and shoulder shrug  were normal and symmetric. Motor: The motor testing reveals 5 over 5 strength of all 4 extremities. Good symmetric motor tone is noted throughout.  Sensory: Sensory testing is intact to soft touch on all 4 extremities. No evidence of extinction is noted.  Coordination: Cerebellar testing reveals good finger-nose-finger and heel-to-shin bilaterally.  Gait and station: Gait is normal.    DIAGNOSTIC DATA (LABS, IMAGING, TESTING) - I reviewed patient records, labs, notes, testing and imaging myself where available.  No flowsheet data found.   Lab Results  Component Value Date   WBC 6.1 08/17/2018   HGB 14.9 08/17/2018   HCT 43.9 08/17/2018   MCV 87.5 08/17/2018   PLT 309.0 08/17/2018      Component Value Date/Time   NA 138 11/13/2018  0953   K 3.8 11/13/2018 0953   CL 103 11/13/2018 0953   CO2 27 11/13/2018 0953   GLUCOSE 116 (H) 11/13/2018 0953   BUN 11 11/13/2018 0953   CREATININE 0.89 11/13/2018 0953   CALCIUM 9.9 11/13/2018 0953   PROT 7.1 08/17/2018 0927   ALBUMIN 4.2 08/17/2018 0927   AST 18 08/17/2018 0927   ALT 23 08/17/2018 0927   ALKPHOS 56 08/17/2018 0927   BILITOT 0.3 08/17/2018 0927   GFRNONAA >60 09/11/2017 0928   GFRAA >60 09/11/2017 0928   Lab Results  Component Value Date   CHOL 242 (H) 11/13/2018   HDL 46.70 11/13/2018   LDLDIRECT 150.0 11/13/2018   TRIG 331.0 (H) 11/13/2018   CHOLHDL 5 11/13/2018   Lab Results  Component Value Date   HGBA1C 6.1 08/17/2018   Lab Results  Component Value Date   VITAMINB12 252 03/25/2015   Lab Results  Component Value Date   TSH 1.17 08/17/2018       ASSESSMENT AND PLAN 43 y.o. year old male  has a past medical history of Alcohol addiction ([place]), High cholesterol, and Hypertension. here with     ICD-10-CM   1. OSA on CPAP  G47.33 For home use only DME continuous positive airway pressure (CPAP)   Z99.89     Diem is ready to get started on CPAP therapy.  We will reorder auto titrating CPAP at 6 to 12 cm of water with heated humidity.  Supplies as needed.  He was educated on risk of untreated sleep apnea.  I have encouraged him to use CPAP nightly and for greater than 4 hours each night. He will follow up closely with PCP and cardiology for any changes or worsening chest pain.  He will follow-up with me in 3 months, sooner if needed.  He verbalizes understanding and agreement with this plan.   Orders Placed This Encounter  Procedures   For home use only DME continuous positive airway pressure (CPAP)  Please start Koki Cesaro on Auto-PAP at 6-12 cwp with heated humidity.  Mask: Nasal mask with chinstrap if needed, size mask to fit.  CPAP related supplies, such as filters, hose, headgear needed.  Compliance data required with residual  AHI, total days, days used, average usage, leak information and pressure information, 90 days post-set up.  Please fax compliance data to: Attention Dr. Rexene AlbertsAE:[id number].    Order Specific Question:   Length of Need    Answer:   Lifetime    Order Specific Question:   Patient has OSA or probable OSA    Answer:   Yes    Order Specific Question:   Is the patient currently using CPAP in the home    Answer:   Yes    Order Specific Question:   Settings    Answer:   Autotitration    Order Specific Question:   CPAP supplies needed    Answer:   Mask, headgear, cushions, filters, heated tubing and water chamber     No orders of the defined types were placed in this encounter.     I spent 15 minutes with the patient. 50% of this time was spent counseling and educating patient on plan of care and medications.    Debbora Presto, FNP-C 12/05/2018, 9:52 AM [facility] [address] [place], [place] [address] [phone]   I reviewed the above note and documentation by the Nurse Practitioner and agree with the history, exam, assessment and plan as outlined above. I was immediately available for consultation. Patient to seek immediate medical attention for chest pain.  Star Age, MD, PhD [facility])

## 2018-12-05 ENCOUNTER — Encounter: Payer: Self-pay | Admitting: Family Medicine

## 2018-12-05 ENCOUNTER — Other Ambulatory Visit: Payer: Self-pay

## 2018-12-05 ENCOUNTER — Ambulatory Visit (INDEPENDENT_AMBULATORY_CARE_PROVIDER_SITE_OTHER): Payer: Managed Care, Other (non HMO) | Admitting: Family Medicine

## 2018-12-05 VITALS — BP 131/78 | HR 66 | Temp 98.0°F | Ht 66.0 in | Wt 264.4 lb

## 2018-12-05 DIAGNOSIS — Z9989 Dependence on other enabling machines and devices: Secondary | ICD-10-CM | POA: Diagnosis not present

## 2018-12-05 DIAGNOSIS — G4733 Obstructive sleep apnea (adult) (pediatric): Secondary | ICD-10-CM | POA: Diagnosis not present

## 2018-12-05 NOTE — Patient Instructions (Signed)
Use CPAP nightly and for greater than 4 hours each night  Follow up in 3 months, sooner if needed  CPAP and BPAP Information CPAP and BPAP are methods of helping a person breathe with the use of air pressure. CPAP stands for "continuous positive airway pressure." BPAP stands for "bi-level positive airway pressure." In both methods, air is blown through your nose or mouth and into your air passages to help you breathe well. CPAP and BPAP use different amounts of pressure to blow air. With CPAP, the amount of pressure stays the same while you breathe in and out. With BPAP, the amount of pressure is increased when you breathe in (inhale) so that you can take larger breaths. Your health care provider will recommend whether CPAP or BPAP would be more helpful for you. Why are CPAP and BPAP treatments used? CPAP or BPAP can be helpful if you have:  Sleep apnea.  Chronic obstructive pulmonary disease (COPD).  Heart failure.  Medical conditions that weaken the muscles of the chest including muscular dystrophy, or neurological diseases such as amyotrophic lateral sclerosis (ALS).  Other problems that cause breathing to be weak, abnormal, or difficult. CPAP is most commonly used for obstructive sleep apnea (OSA) to keep the airways from collapsing when the muscles relax during sleep. How is CPAP or BPAP administered? Both CPAP and BPAP are provided by a small machine with a flexible plastic tube that attaches to a plastic mask. You wear the mask. Air is blown through the mask into your nose or mouth. The amount of pressure that is used to blow the air can be adjusted on the machine. Your health care provider will determine the pressure setting that should be used based on your individual needs. When should CPAP or BPAP be used? In most cases, the mask only needs to be worn during sleep. Generally, the mask needs to be worn throughout the night and during any daytime naps. People with certain medical  conditions may also need to wear the mask at other times when they are awake. Follow instructions from your health care provider about when to use the machine. What are some tips for using the mask?   Because the mask needs to be snug, some people feel trapped or closed-in (claustrophobic) when first using the mask. If you feel this way, you may need to get used to the mask. One way to do this is by holding the mask loosely over your nose or mouth and then gradually applying the mask more snugly. You can also gradually increase the amount of time that you use the mask.  Masks are available in various types and sizes. Some fit over your mouth and nose while others fit over just your nose. If your mask does not fit well, talk with your health care provider about getting a different one.  If you are using a mask that fits over your nose and you tend to breathe through your mouth, a chin strap may be applied to help keep your mouth closed.  The CPAP and BPAP machines have alarms that may sound if the mask comes off or develops a leak.  If you have trouble with the mask, it is very important that you talk with your health care provider about finding a way to make the mask easier to tolerate. Do not stop using the mask. Stopping the use of the mask could have a negative impact on your health. What are some tips for using the machine?  Place your CPAP or BPAP machine on a secure table or stand near an electrical outlet.  Know where the on/off switch is located on the machine.  Follow instructions from your health care provider about how to set the pressure on your machine and when you should use it.  Do not eat or drink while the CPAP or BPAP machine is on. Food or fluids could get pushed into your lungs by the pressure of the CPAP or BPAP.  Do not smoke. Tobacco smoke residue can damage the machine.  For home use, CPAP and BPAP machines can be rented or purchased through home health care  companies. Many different brands of machines are available. Renting a machine before purchasing may help you find out which particular machine works well for you.  Keep the CPAP or BPAP machine and attachments clean. Ask your health care provider for specific instructions. Get help right away if:  You have redness or open areas around your nose or mouth where the mask fits.  You have trouble using the CPAP or BPAP machine.  You cannot tolerate wearing the CPAP or BPAP mask.  You have pain, discomfort, and bloating in your abdomen. Summary  CPAP and BPAP are methods of helping a person breathe with the use of air pressure.  Both CPAP and BPAP are provided by a small machine with a flexible plastic tube that attaches to a plastic mask.  If you have trouble with the mask, it is very important that you talk with your health care provider about finding a way to make the mask easier to tolerate. This information is not intended to replace advice given to you by your health care provider. Make sure you discuss any questions you have with your health care provider. Document Released: 12/11/2003 Document Revised: 07/04/2018 Document Reviewed: 02/01/2016 Elsevier Patient Education  [place].   Sleep Apnea Sleep apnea affects breathing during sleep. It causes breathing to stop for a short time or to become shallow. It can also increase the risk of:  Heart attack.  Stroke.  Being very overweight (obese).  Diabetes.  Heart failure.  Irregular heartbeat. The goal of treatment is to help you breathe normally again. What are the causes? There are three kinds of sleep apnea:  Obstructive sleep apnea. This is caused by a blocked or collapsed airway.  Central sleep apnea. This happens when the brain does not send the right signals to the muscles that control breathing.  Mixed sleep apnea. This is a combination of obstructive and central sleep apnea. The most common cause of this  condition is a collapsed or blocked airway. This can happen if:  Your throat muscles are too relaxed.  Your tongue and tonsils are too large.  You are overweight.  Your airway is too small. What increases the risk?  Being overweight.  Smoking.  Having a small airway.  Being older.  Being male.  Drinking alcohol.  Taking medicines to calm yourself (sedatives or tranquilizers).  Having family members with the condition. What are the signs or symptoms?  Trouble staying asleep.  Being sleepy or tired during the day.  Getting angry a lot.  Loud snoring.  Headaches in the morning.  Not being able to focus your mind (concentrate).  Forgetting things.  Less interest in sex.  Mood swings.  Personality changes.  Feelings of sadness (depression).  Waking up a lot during the night to pee (urinate).  Dry mouth.  Sore throat. How is this diagnosed?  Your medical history.  A physical exam.  A test that is done when you are sleeping (sleep study). The test is most often done in a sleep lab but may also be done at home. How is this treated?   Sleeping on your side.  Using a medicine to get rid of mucus in your nose (decongestant).  Avoiding the use of alcohol, medicines to help you relax, or certain pain medicines (narcotics).  Losing weight, if needed.  Changing your diet.  Not smoking.  Using a machine to open your airway while you sleep, such as: ? An oral appliance. This is a mouthpiece that shifts your lower jaw forward. ? A CPAP device. This device blows air through a mask when you breathe out (exhale). ? An EPAP device. This has valves that you put in each nostril. ? A BPAP device. This device blows air through a mask when you breathe in (inhale) and breathe out.  Having surgery if other treatments do not work. It is important to get treatment for sleep apnea. Without treatment, it can lead to:  High blood pressure.  Coronary artery  disease.  In men, not being able to have an erection (impotence).  Reduced thinking ability. Follow these instructions at home: Lifestyle  Make changes that your doctor recommends.  Eat a healthy diet.  Lose weight if needed.  Avoid alcohol, medicines to help you relax, and some pain medicines.  Do not use any products that contain nicotine or tobacco, such as cigarettes, e-cigarettes, and chewing tobacco. If you need help quitting, ask your doctor. General instructions  Take over-the-counter and prescription medicines only as told by your doctor.  If you were given a machine to use while you sleep, use it only as told by your doctor.  If you are having surgery, make sure to tell your doctor you have sleep apnea. You may need to bring your device with you.  Keep all follow-up visits as told by your doctor. This is important. Contact a doctor if:  The machine that you were given to use during sleep bothers you or does not seem to be working.  You do not get better.  You get worse. Get help right away if:  Your chest hurts.  You have trouble breathing in enough air.  You have an uncomfortable feeling in your back, arms, or stomach.  You have trouble talking.  One side of your body feels weak.  A part of your face is hanging down. These symptoms may be an emergency. Do not wait to see if the symptoms will go away. Get medical help right away. Call your local emergency services (911 in the [place] Do not drive yourself to the hospital. Summary  This condition affects breathing during sleep.  The most common cause is a collapsed or blocked airway.  The goal of treatment is to help you breathe normally while you sleep. This information is not intended to replace advice given to you by your health care provider. Make sure you discuss any questions you have with your health care provider. Document Released: 12/22/2007 Document Revised: 12/29/2017 Document Reviewed:  11/07/2017 Elsevier Patient Education  2020 [employer].

## 2018-12-19 ENCOUNTER — Ambulatory Visit: Payer: Managed Care, Other (non HMO) | Admitting: Nurse Practitioner

## 2019-01-04 ENCOUNTER — Ambulatory Visit: Payer: Managed Care, Other (non HMO) | Admitting: Nurse Practitioner

## 2019-02-06 MED FILL — ATENOLOL 25 MG TABLET: 25 | 30 days supply | Qty: 30 | Fill #1

## 2019-02-06 MED FILL — LIVALO 2 MG TABLET: 2 | 90 days supply | Qty: 45 | Fill #2

## 2019-02-11 ENCOUNTER — Ambulatory Visit: Payer: Managed Care, Other (non HMO) | Admitting: Cardiology

## 2019-02-12 ENCOUNTER — Telehealth: Payer: Self-pay | Admitting: Nurse Practitioner

## 2019-02-12 NOTE — Telephone Encounter (Signed)

## 2019-02-13 ENCOUNTER — Encounter: Payer: Self-pay | Admitting: Nurse Practitioner

## 2019-02-13 ENCOUNTER — Other Ambulatory Visit: Payer: Self-pay

## 2019-02-13 ENCOUNTER — Ambulatory Visit (INDEPENDENT_AMBULATORY_CARE_PROVIDER_SITE_OTHER): Payer: Managed Care, Other (non HMO) | Admitting: Nurse Practitioner

## 2019-02-13 ENCOUNTER — Telehealth: Payer: Self-pay | Admitting: Family Medicine

## 2019-02-13 VITALS — BP 124/82 | HR 82 | Temp 97.5°F | Ht 66.0 in | Wt 261.0 lb

## 2019-02-13 DIAGNOSIS — Z6841 Body Mass Index (BMI) 40.0 and over, adult: Secondary | ICD-10-CM

## 2019-02-13 DIAGNOSIS — I10 Essential (primary) hypertension: Secondary | ICD-10-CM | POA: Diagnosis not present

## 2019-02-13 MED ORDER — ATENOLOL 25 MG PO TABS: 12.5000 mg | ORAL_TABLET | Freq: Two times a day (BID) | ORAL | 1 refills | Status: DC

## 2019-02-13 MED ORDER — CANDESARTAN CILEXETIL-HCTZ 32-12.5 MG PO TABS: 1.0000 | ORAL_TABLET | Freq: Every day | ORAL | 1 refills | Status: DC

## 2019-02-13 MED FILL — CANDESARTAN-HCTZ 32MG/12.5M: 32-12.5 | 90 days supply | Qty: 90 | Fill #0

## 2019-02-13 NOTE — Telephone Encounter (Signed)
I called pt and relayed that sent CM to [facility] (with his message attached).  Received note that they did receive and will process.  He was given there number as well if he needs.

## 2019-02-13 NOTE — Telephone Encounter (Signed)
Received orders.  Will process per [facility].

## 2019-02-13 NOTE — Telephone Encounter (Signed)
Patient called stating that he purchased his cpap machine through another company and not with [facility] however, patient states [facility] will help set his machine up and is needing his pressures in order to do so but have not received the original orders for a cpap machine with corresponding information attached. Please follow up.

## 2019-02-13 NOTE — Progress Notes (Signed)
Subjective:  Patient ID: Patrick Levine, male    DOB: 04/29/1975  Age: 43 y.o. MRN: [id number]  CC: Follow-up (DM and HTN, hyperlipidemia/)  HPI HTN: BP at goal with amlodipine, atenolol and atacand/HCTZ Patrick Levine is not using CPAP as directed Continue to smoke daily Denies any ETOH, marijuana or cocaine use in last 21months Persistent LE edema No chest pain, no headache, no SOB, no PND. Admits Patrick Levine does not always maintain DASH diet. BP Readings from Last 3 Encounters:  02/14/19 (!) 144/88  02/13/19 124/82  12/05/18 131/78   Reviewed past Medical, Social and Family history today.  Outpatient Medications Prior to Visit  Medication Sig Dispense Refill  . amLODipine (NORVASC) 10 MG tablet Take 1 tablet (10 mg total) by mouth daily. 90 tablet 3  . diphenhydrAMINE (BENADRYL) 25 mg capsule Take 1 capsule (25 mg total) by mouth every 8 (eight) hours as needed. 30 capsule 0  . EPINEPHrine 0.3 mg/0.3 mL IJ SOAJ injection Inject 0.3 mLs (0.3 mg total) into the muscle as needed for anaphylaxis. 1 Device 1  . Multiple Vitamin (ONE-A-DAY MENS PO) Take 1 tablet by mouth.      . omega-3 acid ethyl esters (LOVAZA) 1 g capsule Take 2 capsules (2 g total) by mouth 2 (two) times daily. 120 capsule 11  . omeprazole (PRILOSEC) 20 MG capsule TAKE 1 CAPSULE BY MOUTH ONCE A DAY 90 capsule 0  . Pitavastatin Calcium 2 MG TABS Take 0.5 tablets (1 mg total) by mouth daily. 45 tablet 3  . atenolol (TENORMIN) 25 MG tablet Take 0.5 tablets (12.5 mg total) by mouth 2 (two) times daily. 30 tablet 5  . candesartan-hydrochlorothiazide (ATACAND HCT) 32-12.5 MG tablet Take 1 tablet by mouth daily. 90 tablet 1   No facility-administered medications prior to visit.     ROS See HPI  Objective:  BP 124/82   Pulse 82   Temp (!) 97.5 F (36.4 C) (Tympanic)   Ht 5\' 6"  (1.676 m)   Wt 261 lb (118.4 kg)   SpO2 98%   BMI 42.13 kg/m   BP Readings from Last 3 Encounters:  02/14/19 (!) 144/88  02/13/19 124/82   12/05/18 131/78    Wt Readings from Last 3 Encounters:  02/14/19 260 lb (117.9 kg)  02/13/19 261 lb (118.4 kg)  12/05/18 264 lb 6.4 oz (119.9 kg)   Physical Exam Constitutional:      Appearance: Patrick Levine is obese.  Cardiovascular:     Rate and Rhythm: Normal rate and regular rhythm.     Pulses: Normal pulses.     Heart sounds: Normal heart sounds.  Pulmonary:     Effort: Pulmonary effort is normal.     Breath sounds: Normal breath sounds.  Musculoskeletal:     Right lower leg: Edema present.     Left lower leg: Edema present.  Neurological:     Mental Status: Patrick Levine is alert and oriented to person, place, and time.     Lab Results  Component Value Date   WBC 6.1 08/17/2018   HGB 14.9 08/17/2018   HCT 43.9 08/17/2018   PLT 309.0 08/17/2018   GLUCOSE 114 (H) 02/14/2019   CHOL 207 (H) 02/14/2019   TRIG 190 (H) 02/14/2019   HDL 42 02/14/2019   LDLDIRECT 150.0 11/13/2018   LDLCALC 131 (H) 02/14/2019   ALT 22 02/14/2019   AST 17 02/14/2019   NA 140 02/14/2019   K 4.2 02/14/2019   CL 104 02/14/2019   CREATININE 0.84 02/14/2019  BUN 15 02/14/2019   CO2 21 02/14/2019   TSH 0.585 02/14/2019   HGBA1C 6.1 08/17/2018    Assessment & Plan:   Patrick Levine was seen today for follow-up.  Diagnoses and all orders for this visit:  Essential hypertension -     candesartan-hydrochlorothiazide (ATACAND HCT) 32-12.5 MG tablet; Take 1 tablet by mouth daily. -     atenolol (TENORMIN) 25 MG tablet; Take 0.5 tablets (12.5 mg total) by mouth 2 (two) times daily.  Morbid obesity ([place])  BMI 40.0-44.9, adult ([place])   I am having Patrick Levine maintain his Multiple Vitamin (ONE-A-DAY MENS PO), EPINEPHrine, amLODipine, omega-3 acid ethyl esters, Pitavastatin Calcium, diphenhydrAMINE, omeprazole, candesartan-hydrochlorothiazide, and atenolol.  Meds ordered this encounter  Medications  . candesartan-hydrochlorothiazide (ATACAND HCT) 32-12.5 MG tablet    Sig: Take 1 tablet by mouth daily.     Dispense:  90 tablet    Refill:  1    Order Specific Question:   Supervising Provider    Answer:   Lucille Passy [[id number]]  . atenolol (TENORMIN) 25 MG tablet    Sig: Take 0.5 tablets (12.5 mg total) by mouth 2 (two) times daily.    Dispense:  90 tablet    Refill:  1    Order Specific Question:   Supervising Provider    Answer:   Lucille Passy [[id number]]   Problem List Items Addressed This Visit      Cardiovascular and Mediastinum   HTN (hypertension) - Primary   Relevant Medications   candesartan-hydrochlorothiazide (ATACAND HCT) 32-12.5 MG tablet   atenolol (TENORMIN) 25 MG tablet     Other   Class 2 severe obesity due to excess calories with serious comorbidity and body mass index (BMI) of 36.0 to 36.9 in adult [facility])    We discussed the importance for CPAP compliance and DASH diet again. I offered referral to nutritionist and Patrick Levine declined. Patrick Levine agreed to make changes to diet       Other Visit Diagnoses    BMI 40.0-44.9, adult ([place])          Follow-up: Return in about 3 months (around 05/16/2019) for HTN and , hyperlipidemia (fasting, F2F).  [name], NP

## 2019-02-13 NOTE — Patient Instructions (Addendum)
Maintain DASH diet and quit tobacco use  Take atenolol BID.  Maintain appt with cardiology  Start use of CPAP as prescribed.

## 2019-02-13 NOTE — Telephone Encounter (Signed)
Sent CM to [employer] about his message.

## 2019-02-14 ENCOUNTER — Ambulatory Visit (INDEPENDENT_AMBULATORY_CARE_PROVIDER_SITE_OTHER): Payer: Managed Care, Other (non HMO) | Admitting: Cardiology

## 2019-02-14 ENCOUNTER — Encounter: Payer: Self-pay | Admitting: Cardiology

## 2019-02-14 VITALS — BP 144/88 | HR 66 | Ht 65.0 in | Wt 260.0 lb

## 2019-02-14 DIAGNOSIS — F17210 Nicotine dependence, cigarettes, uncomplicated: Secondary | ICD-10-CM

## 2019-02-14 DIAGNOSIS — E782 Mixed hyperlipidemia: Secondary | ICD-10-CM

## 2019-02-14 DIAGNOSIS — I10 Essential (primary) hypertension: Secondary | ICD-10-CM

## 2019-02-14 DIAGNOSIS — G4730 Sleep apnea, unspecified: Secondary | ICD-10-CM

## 2019-02-14 DIAGNOSIS — Z1329 Encounter for screening for other suspected endocrine disorder: Secondary | ICD-10-CM

## 2019-02-14 DIAGNOSIS — Z6836 Body mass index (BMI) 36.0-36.9, adult: Secondary | ICD-10-CM | POA: Diagnosis not present

## 2019-02-14 NOTE — Patient Instructions (Addendum)
Medication Instructions:  Your physician recommends that you continue on your current medications as directed. Please refer to the Current Medication list given to you today.  *If you need a refill on your cardiac medications before your next appointment, please call your pharmacy*  Lab Work: Your physician recommends that you have a BMP, TSh, lipid and hepatic drawn today.  If you have labs (blood work) drawn today and your tests are completely normal, you will receive your results only by: Marland Kitchen MyChart Message (if you have MyChart) OR . A paper copy in the mail If you have any lab test that is abnormal or we need to change your treatment, we will call you to review the results.  Testing/Procedures: You had an EKG performed today  YOU will be contacted to schedule CT calcium score at [address], [address], [place], [place]        . THERE IS A $150 fee due upon time of services.  Follow-Up: At [facility], you and your health needs are our priority.  As part of our continuing mission to provide you with exceptional heart care, we have created designated Provider Care Teams.  These Care Teams include your primary Cardiologist (physician) and Advanced Practice Providers (APPs -  Physician Assistants and Nurse Practitioners) who all work together to provide you with the care you need, when you need it.  Your next appointment:   6 month(s)  The format for your next appointment:   In Person  Provider:   Jyl Heinz, MD  Other Instructions  Coronary Calcium Scan A coronary calcium scan is an imaging test used to look for deposits of calcium and other fatty materials (plaques) in the inner lining of the blood vessels of the heart (coronary arteries). These deposits of calcium and plaques can partly clog and narrow the coronary arteries without producing any symptoms or warning signs. This puts a person at risk for a heart attack. This test can detect these deposits before symptoms  develop. Tell a health care provider about:  Any allergies you have.  All medicines you are taking, including vitamins, herbs, eye drops, creams, and over-the-counter medicines.  Any problems you or family members have had with anesthetic medicines.  Any blood disorders you have.  Any surgeries you have had.  Any medical conditions you have.  Whether you are pregnant or may be pregnant. What are the risks? Generally, this is a safe procedure. However, problems may occur, including:  Harm to a pregnant woman and her unborn baby. This test involves the use of radiation. Radiation exposure can be dangerous to a pregnant woman and her unborn baby. If you are pregnant, you generally should not have this procedure done.  Slight increase in the risk of cancer. This is because of the radiation involved in the test. What happens before the procedure? No preparation is needed for this procedure. What happens during the procedure?   You will undress and remove any jewelry around your neck or chest.  You will put on a hospital gown.  Sticky electrodes will be placed on your chest. The electrodes will be connected to an electrocardiogram (ECG) machine to record a tracing of the electrical activity of your heart.  A CT scanner will take pictures of your heart. During this time, you will be asked to lie still and hold your breath for 2-3 seconds while a picture of your heart is being taken. The procedure may vary among health care providers and hospitals.  What happens after the procedure?  You can get dressed.  You can return to your normal activities.  It is up to you to get the results of your test. Ask your health care provider, or the department that is doing the test, when your results will be ready. Summary  A coronary calcium scan is an imaging test used to look for deposits of calcium and other fatty materials (plaques) in the inner lining of the blood vessels of the heart  (coronary arteries).  Generally, this is a safe procedure. Tell your health care provider if you are pregnant or may be pregnant.  No preparation is needed for this procedure.  A CT scanner will take pictures of your heart.  You can return to your normal activities after the scan is done. This information is not intended to replace advice given to you by your health care provider. Make sure you discuss any questions you have with your health care provider. Document Released: 09/10/2007 Document Revised: 02/24/2017 Document Reviewed: 02/01/2016 Elsevier Patient Education  2020 [employer].

## 2019-02-14 NOTE — Progress Notes (Signed)
Cardiology Office Note:    Date:  02/14/2019   ID:  Patrick Levine, DOB June 17, 1975, MRN [id number]  PCP:  Flossie Buffy, NP  Cardiologist:  Jenean Lindau, MD   Referring MD: Flossie Buffy, NP    ASSESSMENT:    1. Essential hypertension   2. Sleep apnea, unspecified type   3. Cigarette smoker   4. Class 2 severe obesity due to excess calories with serious comorbidity and body mass index (BMI) of 36.0 to 36.9 in adult [facility])    PLAN:    In order of problems listed above:  1. Essential hypertension: Primary prevention stressed with the patient.  Importance of compliance with diet and medication stressed and he vocalized understanding.  Blood pressure is stable.  He had an argument with his wife this morning so he says it is on the higher side.  He will continue to monitor it.  Salt intake issues were discussed. 2. Mixed dyslipidemia: He will have lab work done today and diet was discussed extensively 3. Morbid obesity: Diet was discussed weight reduction was stressed importance of regular exercise stressed Sleep health issues were discussed For risk stratification I would like to get a calcium CT score and he is agreeable.  He will have fasting blood work today. Patient will be seen in follow-up appointment in 6 months or earlier if the patient has any concerns    Medication Adjustments/Labs and Tests Ordered: Current medicines are reviewed at length with the patient today.  Concerns regarding medicines are outlined above.  No orders of the defined types were placed in this encounter.  No orders of the defined types were placed in this encounter.    No chief complaint on file.    History of Present Illness:    Patrick Levine is a 43 y.o. male.  Patient has past medical history of essential hypertension, dyslipidemia and morbid obesity.  He leads a fairly sedentary lifestyle.  No chest pain orthopnea or PND.  At the time of my evaluation, the patient  is alert awake oriented and in no distress.  Unfortunately continues to smoke.  Past Medical History:  Diagnosis Date  . Alcohol addiction ([place])   . High cholesterol   . Hypertension     Past Surgical History:  Procedure Laterality Date  . LACERATION REPAIR  2001   lip laceration--stitches.    Current Medications: Current Meds  Medication Sig  . amLODipine (NORVASC) 10 MG tablet Take 1 tablet (10 mg total) by mouth daily.  Marland Kitchen atenolol (TENORMIN) 25 MG tablet Take 0.5 tablets (12.5 mg total) by mouth 2 (two) times daily.  . candesartan-hydrochlorothiazide (ATACAND HCT) 32-12.5 MG tablet Take 1 tablet by mouth daily.  . diphenhydrAMINE (BENADRYL) 25 mg capsule Take 1 capsule (25 mg total) by mouth every 8 (eight) hours as needed.  Marland Kitchen EPINEPHrine 0.3 mg/0.3 mL IJ SOAJ injection Inject 0.3 mLs (0.3 mg total) into the muscle as needed for anaphylaxis.  . Multiple Vitamin (ONE-A-DAY MENS PO) Take 1 tablet by mouth.    . omega-3 acid ethyl esters (LOVAZA) 1 g capsule Take 2 capsules (2 g total) by mouth 2 (two) times daily.  Marland Kitchen omeprazole (PRILOSEC) 20 MG capsule TAKE 1 CAPSULE BY MOUTH ONCE A DAY  . Pitavastatin Calcium 2 MG TABS Take 0.5 tablets (1 mg total) by mouth daily.     Allergies:   Bee venom and Pravastatin sodium   Social History   Socioeconomic History  . Marital status: Married  Spouse name: Not on file  . Number of children: 4  . Years of education: Not on file  . Highest education level: Not on file  Occupational History  . Occupation: MENTAL HEALTH/CASE MANAGER    Employer: [employer]  . Financial resource strain: Not on file  . Food insecurity    Worry: Not on file    Inability: Not on file  . Transportation needs    Medical: Not on file    Non-medical: Not on file  Tobacco Use  . Smoking status: Current Some Day Smoker    Packs/day: 0.50    Types: Cigarettes  . Smokeless tobacco: Never Used  Substance and Sexual Activity  .  Alcohol use: Yes    Alcohol/week: 8.0 standard drinks    Types: 5 Cans of beer, 3 Shots of liquor per week    Comment: 6-8 drinks/day  . Drug use: Yes    Frequency: 3.0 times per week    Types: "Crack" cocaine, Marijuana    Comment:  use of cocaine and marijuana  . Sexual activity: Yes  Lifestyle  . Physical activity    Days per week: Not on file    Minutes per session: Not on file  . Stress: Not on file  Relationships  . Social Herbalist on phone: Not on file    Gets together: Not on file    Attends religious service: Not on file    Active member of club or organization: Not on file    Attends meetings of clubs or organizations: Not on file    Relationship status: Not on file  Other Topics Concern  . 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   Married        Family History: The patient's family history includes Hypertension in his father and paternal grandfather.  ROS:   Please see the history of present illness.    All other systems reviewed and are negative.  EKGs/Labs/Other Studies Reviewed:    The following studies were reviewed today: EKG reveals sinus rhythm and nonspecific ST-T changes.   Recent Labs: 08/17/2018: ALT 23; Hemoglobin 14.9; Platelets 309.0; TSH 1.17 11/13/2018: BUN 11; Creatinine, Ser 0.89; Potassium 3.8; Sodium 138  Recent Lipid Panel    Component Value Date/Time   CHOL 242 (H) 11/13/2018 0953   TRIG 331.0 (H) 11/13/2018 0953   HDL 46.70 11/13/2018 0953   CHOLHDL 5 11/13/2018 0953   VLDL 66.2 (H) 11/13/2018 0953   LDLDIRECT 150.0 11/13/2018 0953    Physical Exam:    VS:  BP (!) 144/88 (BP Location: Right Arm, Patient Position: Sitting, Cuff Size: Normal)   Pulse 66   Ht 5\' 5"  (1.651 m)   Wt 260 lb (117.9 kg)   SpO2 99%   BMI 43.27 kg/m     Wt Readings from Last 3 Encounters:   02/14/19 260 lb (117.9 kg)  02/13/19 261 lb (118.4 kg)  12/05/18 264 lb 6.4 oz (119.9 kg)     GEN: Patient is in no acute distress HEENT: Normal NECK: No JVD; No carotid bruits LYMPHATICS: No lymphadenopathy CARDIAC: Hear sounds regular, 2/6 systolic murmur at the apex. RESPIRATORY:  Clear to auscultation without rales, wheezing or rhonchi  ABDOMEN: Soft, non-tender, non-distended MUSCULOSKELETAL:  No edema; No deformity  SKIN: Warm  and dry NEUROLOGIC:  Alert and oriented x 3 PSYCHIATRIC:  Normal affect   Signed, Jenean Lindau, MD  02/14/2019 8:31 AM    [place]

## 2019-02-15 ENCOUNTER — Telehealth: Payer: Self-pay

## 2019-02-15 LAB — BASIC METABOLIC PANEL
BUN/Creatinine Ratio: 18 (ref 9–20)
BUN: 15 mg/dL (ref 6–24)
CO2: 21 mmol/L (ref 20–29)
Calcium: 9.7 mg/dL (ref 8.7–10.2)
Chloride: 104 mmol/L (ref 96–106)
Creatinine, Ser: 0.84 mg/dL (ref 0.76–1.27)
GFR calc Af Amer: 124 mL/min/{1.73_m2} (ref >59)
GFR calc non Af Amer: 107 mL/min/{1.73_m2} (ref >59)
Glucose: 114 mg/dL — ABNORMAL HIGH (ref 65–99)
Potassium: 4.2 mmol/L (ref 3.5–5.2)
Sodium: 140 mmol/L (ref 134–144)

## 2019-02-15 LAB — LIPID PANEL
Chol/HDL Ratio: 4.9 ratio (ref 0.0–5.0)
Cholesterol, Total: 207 mg/dL — ABNORMAL HIGH (ref 100–199)
HDL: 42 mg/dL (ref >39)
LDL Chol Calc (NIH): 131 mg/dL — ABNORMAL HIGH (ref 0–99)
Triglycerides: 190 mg/dL — ABNORMAL HIGH (ref 0–149)
VLDL Cholesterol Cal: 34 mg/dL (ref 5–40)

## 2019-02-15 LAB — HEPATIC FUNCTION PANEL
ALT: 22 IU/L (ref 0–44)
AST: 17 IU/L (ref 0–40)
Albumin: 4.4 g/dL (ref 4.0–5.0)
Alkaline Phosphatase: 68 IU/L (ref 39–117)
Bilirubin Total: <0.2 mg/dL (ref 0.0–1.2)
Bilirubin, Direct: 0.06 mg/dL (ref 0.00–0.40)
Total Protein: 7 g/dL (ref 6.0–8.5)

## 2019-02-15 LAB — TSH: TSH: 0.585 u[IU]/mL (ref 0.450–4.500)

## 2019-02-15 NOTE — Telephone Encounter (Signed)
Results relayed, copy sent to Dr. Lorayne Marek.

## 2019-02-15 NOTE — Telephone Encounter (Signed)
-----   Message from Jenean Lindau, MD sent at 02/15/2019  9:50 AM EST ----- The results of the study is unremarkable.  He needs to get his cholesterol better with diet and exercise.  I will revisit these numbers after calcium score is back.  Please inform patient. I will discuss in detail at next appointment. Cc  primary care/referring physician Jenean Lindau, MD 02/15/2019 9:49 AM

## 2019-02-16 NOTE — Assessment & Plan Note (Addendum)
We discussed his risk factors and the importance for CPAP compliance and DASH diet again. I offered referral to nutritionist and he declined. He agreed to make changes to diet

## 2019-03-01 ENCOUNTER — Inpatient Hospital Stay: Admission: RE | Admit: 2019-03-01 | Payer: Managed Care, Other (non HMO) | Source: Ambulatory Visit

## 2019-03-06 ENCOUNTER — Ambulatory Visit: Payer: Managed Care, Other (non HMO) | Admitting: Family Medicine

## 2019-04-22 ENCOUNTER — Other Ambulatory Visit: Payer: Self-pay | Admitting: Nurse Practitioner

## 2019-04-22 DIAGNOSIS — K219 Gastro-esophageal reflux disease without esophagitis: Secondary | ICD-10-CM

## 2019-04-22 DIAGNOSIS — J040 Acute laryngitis: Secondary | ICD-10-CM

## 2019-04-22 MED FILL — OMEPRAZOLE 20 MG CAP: 20 | 90 days supply | Qty: 90 | Fill #0

## 2019-04-22 MED FILL — ATENOLOL 25 MG TABLET: 25 | 30 days supply | Qty: 30 | Fill #2

## 2019-04-30 ENCOUNTER — Other Ambulatory Visit: Payer: Self-pay

## 2019-04-30 ENCOUNTER — Ambulatory Visit
Admission: EM | Admit: 2019-04-30 | Discharge: 2019-04-30 | Disposition: A | Discharge status: Home / Self Care | Payer: 59 | Attending: Physician Assistant | Admitting: Physician Assistant

## 2019-04-30 DIAGNOSIS — Z8249 Family history of ischemic heart disease and other diseases of the circulatory system: Secondary | ICD-10-CM | POA: Diagnosis not present

## 2019-04-30 DIAGNOSIS — I10 Essential (primary) hypertension: Secondary | ICD-10-CM | POA: Diagnosis not present

## 2019-04-30 DIAGNOSIS — F17210 Nicotine dependence, cigarettes, uncomplicated: Secondary | ICD-10-CM | POA: Insufficient documentation

## 2019-04-30 DIAGNOSIS — E782 Mixed hyperlipidemia: Secondary | ICD-10-CM | POA: Diagnosis not present

## 2019-04-30 DIAGNOSIS — Z79899 Other long term (current) drug therapy: Secondary | ICD-10-CM | POA: Diagnosis not present

## 2019-04-30 DIAGNOSIS — G4730 Sleep apnea, unspecified: Secondary | ICD-10-CM | POA: Diagnosis not present

## 2019-04-30 DIAGNOSIS — Z113 Encounter for screening for infections with a predominantly sexual mode of transmission: Secondary | ICD-10-CM | POA: Diagnosis not present

## 2019-04-30 DIAGNOSIS — Z711 Person with feared health complaint in whom no diagnosis is made: Secondary | ICD-10-CM | POA: Diagnosis not present

## 2019-04-30 DIAGNOSIS — N4889 Other specified disorders of penis: Secondary | ICD-10-CM | POA: Insufficient documentation

## 2019-04-30 NOTE — ED Triage Notes (Signed)
Pt states had un protective intercourse yesterday and now having pressure to penis. Denies pain, just feels pressure. Denies discharge or urinary sx's.

## 2019-04-30 NOTE — Discharge Instructions (Signed)
Cytology sent, you will be contacted with any positive results that requires further treatment. Refrain from sexual activity for the next 7 days. Male: If developing testicular swelling/pain, penile lesion/sore, follow up for reevaluation.

## 2019-04-30 NOTE — ED Provider Notes (Signed)
[facility]    CSN: [id number] Arrival date & time: 04/30/19  0802      History   Chief Complaint Chief Complaint  Patient presents with  . SEXUALLY TRANSMITTED DISEASE    HPI Patrick Levine is a 44 y.o. male.   44 year old male comes in for STD testing. States has "pressure" to the penis yesterday after unprotected intercourse. Denies pain. Denies urinary symptoms such as frequency, dysuria, hematuria. Denies penile discharge, penile lesion, testicular swelling, testicular pain. Denies fever, chills, body aches.     Past Medical History:  Diagnosis Date  . Alcohol addiction ([place])   . High cholesterol   . Hypertension     Patient Active Problem List   Diagnosis Date Noted  . Polysubstance abuse ([place]) 08/17/2018  . Sleep apnea 08/17/2018  . Class 2 severe obesity due to excess calories with serious comorbidity and body mass index (BMI) of 36.0 to 36.9 in adult [facility]) 07/16/2018  . Mixed dyslipidemia 09/13/2017  . Cigarette smoker 09/13/2017  . Hyperglycemia 06/22/2017  . Laryngitis 05/19/2017  . Bilateral carpal tunnel syndrome 10/29/2015  . Numbness in both hands 03/25/2015  . 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 Fracture of ribs, multiple, closed 12/26/2011  . HTN (hypertension) 12/26/2011    Past Surgical History:  Procedure Laterality Date  . LACERATION REPAIR  2001   lip laceration--stitches.       Home Medications    Prior to Admission medications   Medication Sig Start Date End Date Taking? Authorizing Provider  Pitavastatin Calcium 2 MG TABS Take 0.5 tablets (1 mg total) by mouth daily. 09/24/18  Yes Nche, Charlene Brooke, NP  amLODipine (NORVASC) 10 MG tablet Take 1 tablet (10 mg total) by mouth daily. 08/17/18   Nche, Charlene Brooke, NP  atenolol (TENORMIN) 25 MG tablet Take 0.5 tablets (12.5 mg total) by mouth 2 (two) times daily. 02/13/19   Nche, Charlene Brooke, NP   candesartan-hydrochlorothiazide (ATACAND HCT) 32-12.5 MG tablet Take 1 tablet by mouth daily. 02/13/19   Nche, Charlene Brooke, NP  diphenhydrAMINE (BENADRYL) 25 mg capsule Take 1 capsule (25 mg total) by mouth every 8 (eight) hours as needed. 09/24/18   Nche, Charlene Brooke, NP  EPINEPHrine 0.3 mg/0.3 mL IJ SOAJ injection Inject 0.3 mLs (0.3 mg total) into the muscle as needed for anaphylaxis. 07/16/18   Nche, Charlene Brooke, NP  Multiple Vitamin (ONE-A-DAY MENS PO) Take 1 tablet by mouth.      [provider]  omega-3 acid ethyl esters (LOVAZA) 1 g capsule Take 2 capsules (2 g total) by mouth 2 (two) times daily. 08/17/18   Nche, Charlene Brooke, NP  omeprazole (PRILOSEC) 20 MG capsule TAKE 1 CAPSULE BY MOUTH ONCE A DAY 04/22/19   Nche, Charlene Brooke, NP    Family History Family History  Problem Relation Age of Onset  . Hypertension Father   . Hypertension Paternal Grandfather     Social History Social History   Tobacco Use  . Smoking status: Current Some Day Smoker    Packs/day: 0.50    Types: Cigarettes  . Smokeless tobacco: Never Used  Substance Use Topics  . Alcohol use: Yes    Alcohol/week: 8.0 standard drinks    Types: 5 Cans of beer, 3 Shots of liquor per week    Comment: 6-8 drinks/day  . Drug use: Not Currently    Types: "Crack" cocaine     Allergies   Bee  venom and Pravastatin sodium   Review of Systems Review of Systems  Reason unable to perform ROS: See HPI as above.     Physical Exam Triage Vital Signs ED Triage Vitals  Enc Vitals Group     BP 04/30/19 0813 (!) 174/107     Pulse Rate 04/30/19 0813 79     Resp 04/30/19 0813 (!) 79     Temp 04/30/19 0813 98.1 F (36.7 C)     Temp Source 04/30/19 0813 Oral     SpO2 04/30/19 0813 96 %     Weight --      Height --      Head Circumference --      Peak Flow --      Pain Score 04/30/19 0814 0     Pain Loc --      Pain Edu? --      Excl. in [place]? --    No data found.  Updated Vital Signs BP (!)  174/107 (BP Location: Left Arm)   Pulse 79   Temp 98.1 F (36.7 C) (Oral)   Resp 16   SpO2 96%   Physical Exam Constitutional:      General: He is not in acute distress.    Appearance: Normal appearance. He is well-developed. He is not toxic-appearing or diaphoretic.  HENT:     Head: Normocephalic and atraumatic.  Eyes:     Conjunctiva/sclera: Conjunctivae normal.     Pupils: Pupils are equal, round, and reactive to light.  Pulmonary:     Effort: Pulmonary effort is normal. No respiratory distress.     Comments: Speaking in full sentences without difficulty Musculoskeletal:     Cervical back: Normal range of motion and neck supple.  Skin:    General: Skin is warm and dry.  Neurological:     Mental Status: He is alert and oriented to person, place, and time.     UC Treatments / Results  Labs (all labs ordered are listed, but only abnormal results are displayed) Labs Reviewed  CYTOLOGY, (ORAL, ANAL, URETHRAL) ANCILLARY ONLY    EKG   Radiology No results found.  Procedures Procedures (including critical care time)  Medications Ordered in UC Medications - No data to display  Initial Impression / Assessment and Plan / UC Course  I have reviewed the triage vital signs and the nursing notes.  Pertinent labs & imaging results that were available during my care of the patient were reviewed by me and considered in my medical decision making (see chart for details).    Patient requesting empiric treatment. However, no known positive exposure. Would like to wait for testing results to return prior to treatment. Also discussed if cytology negative, may need evaluation by urologist. Cytology sent. Return precautions given.  Final Clinical Impressions(s) / UC Diagnoses   Final diagnoses:  Concern about STD in male without diagnosis   ED Prescriptions    None     PDMP not reviewed this encounter.   Ok Edwards, PA-C 04/30/19 [phone]

## 2019-05-01 LAB — CYTOLOGY, (ORAL, ANAL, URETHRAL) ANCILLARY ONLY
Chlamydia: NEGATIVE
Neisseria Gonorrhea: NEGATIVE
Trichomonas: NEGATIVE

## 2019-05-14 ENCOUNTER — Other Ambulatory Visit: Payer: Self-pay

## 2019-05-15 ENCOUNTER — Encounter: Payer: Self-pay | Admitting: Nurse Practitioner

## 2019-05-15 ENCOUNTER — Ambulatory Visit (INDEPENDENT_AMBULATORY_CARE_PROVIDER_SITE_OTHER): Payer: Managed Care, Other (non HMO) | Admitting: Nurse Practitioner

## 2019-05-15 VITALS — BP 130/84 | HR 74 | Temp 98.2°F | Ht 65.0 in | Wt 260.2 lb

## 2019-05-15 DIAGNOSIS — I10 Essential (primary) hypertension: Secondary | ICD-10-CM

## 2019-05-15 DIAGNOSIS — E782 Mixed hyperlipidemia: Secondary | ICD-10-CM | POA: Diagnosis not present

## 2019-05-15 DIAGNOSIS — Z6836 Body mass index (BMI) 36.0-36.9, adult: Secondary | ICD-10-CM

## 2019-05-15 NOTE — Assessment & Plan Note (Signed)
Improved BP with medication compliance and change in diet Also under the care of cardiology BP Readings from Last 3 Encounters:  05/15/19 130/84  04/30/19 (!) 174/107  02/14/19 (!) 144/88   Advised about the importance of CPAP machine compliance. F/up in 73months

## 2019-05-15 NOTE — Patient Instructions (Addendum)
Epi pen refill at [facility].  Maintain current medications. Good job with diet changes. It is important to use CPAP machine at least 4hrs each night.

## 2019-05-15 NOTE — Progress Notes (Signed)
Subjective:  Patient ID: Patrick Levine, male    DOB: 12-12-1975  Age: 44 y.o. MRN: [id number]  CC: Follow-up (pt reports been doing good, fasting,/refill for epi pen)  HPI HTN, Hyperglycemia, OSA, hyperlipidemia, and Obesity: Improved BP, LDL not at goal, and hgbA1c at 6.1 Mr. Albert reports he has made changes to his diet (low sodium, low carb and low fat). He is unable to maintain daily exercise to to cold weather and work schedule (works 2jobs). He admits to non compliance with CPAP machine. BP Readings from Last 3 Encounters:  05/15/19 130/84  04/30/19 (!) 174/107  02/14/19 (!) 144/88  denies any SOB or PND or LE edema or dizziness or headache.  Reviewed past Medical, Social and Family history today.  Outpatient Medications Prior to Visit  Medication Sig Dispense Refill  . amLODipine (NORVASC) 10 MG tablet Take 1 tablet (10 mg total) by mouth daily. 90 tablet 3  . atenolol (TENORMIN) 25 MG tablet Take 0.5 tablets (12.5 mg total) by mouth 2 (two) times daily. 90 tablet 1  . candesartan-hydrochlorothiazide (ATACAND HCT) 32-12.5 MG tablet Take 1 tablet by mouth daily. 90 tablet 1  . diphenhydrAMINE (BENADRYL) 25 mg capsule Take 1 capsule (25 mg total) by mouth every 8 (eight) hours as needed. 30 capsule 0  . EPINEPHrine 0.3 mg/0.3 mL IJ SOAJ injection Inject 0.3 mLs (0.3 mg total) into the muscle as needed for anaphylaxis. 1 Device 1  . Multiple Vitamin (ONE-A-DAY MENS PO) Take 1 tablet by mouth.      . omega-3 acid ethyl esters (LOVAZA) 1 g capsule Take 2 capsules (2 g total) by mouth 2 (two) times daily. 120 capsule 11  . omeprazole (PRILOSEC) 20 MG capsule TAKE 1 CAPSULE BY MOUTH ONCE A DAY 90 capsule 0  . Pitavastatin Calcium 2 MG TABS Take 0.5 tablets (1 mg total) by mouth daily. 45 tablet 3   No facility-administered medications prior to visit.    ROS See HPI  Objective:  BP 130/84   Pulse 74   Temp 98.2 F (36.8 C) (Tympanic)   Ht 5\' 5"  (1.651 m)   Wt  260 lb 3.2 oz (118 kg)   SpO2 98%   BMI 43.30 kg/m   BP Readings from Last 3 Encounters:  05/15/19 130/84  04/30/19 (!) 174/107  02/14/19 (!) 144/88   Wt Readings from Last 3 Encounters:  05/15/19 260 lb 3.2 oz (118 kg)  02/14/19 260 lb (117.9 kg)  02/13/19 261 lb (118.4 kg)   Physical Exam Vitals reviewed.  Cardiovascular:     Rate and Rhythm: Normal rate and regular rhythm.     Pulses: Normal pulses.     Heart sounds: Normal heart sounds.  Pulmonary:     Effort: Pulmonary effort is normal.     Breath sounds: Normal breath sounds.  Musculoskeletal:     Right lower leg: No edema.     Left lower leg: No edema.  Neurological:     Mental Status: He is alert and oriented to person, place, and time.    Lab Results  Component Value Date   WBC 6.1 08/17/2018   HGB 14.9 08/17/2018   HCT 43.9 08/17/2018   PLT 309.0 08/17/2018   GLUCOSE 114 (H) 02/14/2019   CHOL 207 (H) 02/14/2019   TRIG 190 (H) 02/14/2019   HDL 42 02/14/2019   LDLDIRECT 150.0 11/13/2018   LDLCALC 131 (H) 02/14/2019   ALT 22 02/14/2019   AST 17 02/14/2019   NA 140  02/14/2019   K 4.2 02/14/2019   CL 104 02/14/2019   CREATININE 0.84 02/14/2019   BUN 15 02/14/2019   CO2 21 02/14/2019   TSH 0.585 02/14/2019   HGBA1C 6.1 08/17/2018   Assessment & Plan:  This visit occurred during the SARS-CoV-2 public health emergency.  Safety protocols were in place, including screening questions prior to the visit, additional usage of staff PPE, and extensive cleaning of exam room while observing appropriate contact time as indicated for disinfecting solutions.   Pecos was seen today for follow-up.  Diagnoses and all orders for this visit:  Essential hypertension  Class 2 severe obesity due to excess calories with serious comorbidity and body mass index (BMI) of 36.0 to 36.9 in adult [facility])  Mixed dyslipidemia   I am having Aaron Edelman Blissett maintain his Multiple Vitamin (ONE-A-DAY MENS PO), EPINEPHrine,  amLODipine, omega-3 acid ethyl esters, Pitavastatin Calcium, diphenhydrAMINE, candesartan-hydrochlorothiazide, atenolol, and omeprazole.  No orders of the defined types were placed in this encounter.   Problem List Items Addressed This Visit      Cardiovascular and Mediastinum   HTN (hypertension) - Primary    Improved BP with medication compliance and change in diet Also under the care of cardiology BP Readings from Last 3 Encounters:  05/15/19 130/84  04/30/19 (!) 174/107  02/14/19 (!) 144/88   Advised about the importance of CPAP machine compliance. F/up in 95months        Other   Class 2 severe obesity due to excess calories with serious comorbidity and body mass index (BMI) of 36.0 to 36.9 in adult [facility])   Mixed dyslipidemia    LDL not at goal Current use of pitavastatin and lovaza Pending CT cardiac scoring Lipid Panel     Component Value Date/Time   CHOL 207 (H) 02/14/2019 0855   TRIG 190 (H) 02/14/2019 0855   HDL 42 02/14/2019 0855   CHOLHDL 4.9 02/14/2019 0855   CHOLHDL 5 11/13/2018 0953   VLDL 66.2 (H) 11/13/2018 0953   LDLCALC 131 (H) 02/14/2019 0855   LDLDIRECT 150.0 11/13/2018 0953   LABVLDL 34 02/14/2019 0855           Follow-up: Return in about 6 months (around 11/12/2019) for HTN and , hyperlipidemia (fasting).  [name], NP

## 2019-05-15 NOTE — Assessment & Plan Note (Signed)
LDL not at goal Current use of pitavastatin and lovaza Pending CT cardiac scoring Lipid Panel     Component Value Date/Time   CHOL 207 (H) 02/14/2019 0855   TRIG 190 (H) 02/14/2019 0855   HDL 42 02/14/2019 0855   CHOLHDL 4.9 02/14/2019 0855   CHOLHDL 5 11/13/2018 0953   VLDL 66.2 (H) 11/13/2018 0953   LDLCALC 131 (H) 02/14/2019 0855   LDLDIRECT 150.0 11/13/2018 0953   LABVLDL 34 02/14/2019 0855

## 2019-05-29 MED FILL — CANDESARTAN-HCTZ 32MG/12.5M: 32-12.5 | 90 days supply | Qty: 90 | Fill #0

## 2019-05-29 MED FILL — LIVALO 2 MG TABLET: 2 | 90 days supply | Qty: 45 | Fill #3

## 2019-05-29 MED FILL — AMLODIPINE BESYLATE 10 MG T: 10 | 90 days supply | Qty: 90 | Fill #0

## 2019-09-02 ENCOUNTER — Other Ambulatory Visit: Payer: Self-pay | Admitting: Nurse Practitioner

## 2019-09-02 DIAGNOSIS — K219 Gastro-esophageal reflux disease without esophagitis: Secondary | ICD-10-CM

## 2019-09-02 DIAGNOSIS — J040 Acute laryngitis: Secondary | ICD-10-CM

## 2019-09-27 ENCOUNTER — Other Ambulatory Visit: Payer: Self-pay | Admitting: Nurse Practitioner

## 2019-09-27 DIAGNOSIS — I10 Essential (primary) hypertension: Secondary | ICD-10-CM

## 2019-09-27 DIAGNOSIS — E781 Pure hyperglyceridemia: Secondary | ICD-10-CM

## 2019-09-27 MED FILL — LIVALO 2 MG TABLET: 2 | 90 days supply | Qty: 45 | Fill #0

## 2019-09-27 MED FILL — OMEPRAZOLE 20 MG CAP: 20 | 90 days supply | Qty: 90 | Fill #0

## 2019-09-27 MED FILL — AMLODIPINE BESYLATE 10 MG T: 10 | 90 days supply | Qty: 90 | Fill #0

## 2019-11-11 ENCOUNTER — Other Ambulatory Visit: Payer: Self-pay

## 2019-11-12 ENCOUNTER — Telehealth: Payer: Self-pay | Admitting: Nurse Practitioner

## 2019-11-12 ENCOUNTER — Ambulatory Visit (INDEPENDENT_AMBULATORY_CARE_PROVIDER_SITE_OTHER): Payer: Managed Care, Other (non HMO) | Admitting: Nurse Practitioner

## 2019-11-12 ENCOUNTER — Encounter: Payer: Self-pay | Admitting: Nurse Practitioner

## 2019-11-12 VITALS — BP 146/90 | HR 78 | Temp 97.3°F | Ht 65.0 in | Wt 273.0 lb

## 2019-11-12 DIAGNOSIS — Z6841 Body Mass Index (BMI) 40.0 and over, adult: Secondary | ICD-10-CM

## 2019-11-12 DIAGNOSIS — D229 Melanocytic nevi, unspecified: Secondary | ICD-10-CM | POA: Diagnosis not present

## 2019-11-12 DIAGNOSIS — E782 Mixed hyperlipidemia: Secondary | ICD-10-CM | POA: Diagnosis not present

## 2019-11-12 DIAGNOSIS — R739 Hyperglycemia, unspecified: Secondary | ICD-10-CM | POA: Diagnosis not present

## 2019-11-12 DIAGNOSIS — I10 Essential (primary) hypertension: Secondary | ICD-10-CM

## 2019-11-12 LAB — HEPATIC FUNCTION PANEL
ALT: 20 U/L (ref 0–53)
AST: 15 U/L (ref 0–37)
Albumin: 4.2 g/dL (ref 3.5–5.2)
Alkaline Phosphatase: 56 U/L (ref 39–117)
Bilirubin, Direct: 0 mg/dL (ref 0.0–0.3)
Total Bilirubin: 0.3 mg/dL (ref 0.2–1.2)
Total Protein: 6.8 g/dL (ref 6.0–8.3)

## 2019-11-12 LAB — LIPID PANEL
Cholesterol: 229 mg/dL — ABNORMAL HIGH (ref 0–200)
HDL: 47.4 mg/dL (ref >39.00)
NonHDL: 181.47
Total CHOL/HDL Ratio: 5
Triglycerides: 321 mg/dL — ABNORMAL HIGH (ref 0.0–149.0)
VLDL: 64.2 mg/dL — ABNORMAL HIGH (ref 0.0–40.0)

## 2019-11-12 LAB — BASIC METABOLIC PANEL
BUN: 12 mg/dL (ref 6–23)
CO2: 25 mEq/L (ref 19–32)
Calcium: 9.5 mg/dL (ref 8.4–10.5)
Chloride: 105 mEq/L (ref 96–112)
Creatinine, Ser: 0.88 mg/dL (ref 0.40–1.50)
GFR: 113.53 mL/min (ref >60.00)
Glucose, Bld: 117 mg/dL — ABNORMAL HIGH (ref 70–99)
Potassium: 3.9 mEq/L (ref 3.5–5.1)
Sodium: 138 mEq/L (ref 135–145)

## 2019-11-12 LAB — HEMOGLOBIN A1C: Hgb A1c MFr Bld: 6.2 % (ref 4.6–6.5)

## 2019-11-12 LAB — LDL CHOLESTEROL, DIRECT: Direct LDL: 137 mg/dL

## 2019-11-12 NOTE — Progress Notes (Signed)
Subjective:  Patient ID: Patrick Levine, male    DOB: 1975/09/20  Age: 44 y.o. MRN: [id number]  CC: Follow-up (HTN, hyperlipidemia, pt is fasting, would like to discuss referral to have mole removed from nose. )  HPI He reports growing lesion on his nose,not associated with bleeding or pain. Lesion has been present for over 5years but increase in size for last 16yrs.  Class 2 severe obesity due to excess calories with serious comorbidity and body mass index (BMI) of 36.0 to 36.9 in adult ([place]) 13lbs weight gain in last 55months. Admits to non compliance with CPAP machine, diet and exercise Advised about the importance of maintaining DASH diet, decrease calorie intake, start walking 30-65mins a day, and use CPAP machine daily. Advised him about his risk for DM and CAD.  Repeat Hgb A1c Consider use of metformin?  HTN (hypertension) BP stable but not at goal Non compliant with CPAP machine. BP Readings from Last 3 Encounters:  11/12/19 (!) 146/90  05/15/19 130/84  04/30/19 (!) 174/107   Repeat BMP Maintain current medications at this time  Mixed dyslipidemia He discontinued livalo due to joint pain. Repeat lipid panel today  BP Readings from Last 3 Encounters:  11/12/19 (!) 146/90  05/15/19 130/84  04/30/19 (!) 174/107   Wt Readings from Last 3 Encounters:  11/12/19 273 lb (123.8 kg)  05/15/19 260 lb 3.2 oz (118 kg)  02/14/19 260 lb (117.9 kg)   Reviewed past Medical, Social and Family history today.  Outpatient Medications Prior to Visit  Medication Sig Dispense Refill  . amLODipine (NORVASC) 10 MG tablet TAKE 1 TABLET BY MOUTH DAILY. 90 tablet 3  . atenolol (TENORMIN) 25 MG tablet Take 0.5 tablets (12.5 mg total) by mouth 2 (two) times daily. 90 tablet 1  . candesartan-hydrochlorothiazide (ATACAND HCT) 32-12.5 MG tablet Take 1 tablet by mouth daily. 90 tablet 1  . diphenhydrAMINE (BENADRYL) 25 mg capsule Take 1 capsule (25 mg total) by mouth every 8 (eight) hours  as needed. 30 capsule 0  . EPINEPHrine 0.3 mg/0.3 mL IJ SOAJ injection Inject 0.3 mLs (0.3 mg total) into the muscle as needed for anaphylaxis. 1 Device 1  . Multiple Vitamin (ONE-A-DAY MENS PO) Take 1 tablet by mouth.      . omega-3 acid ethyl esters (LOVAZA) 1 g capsule Take 2 capsules (2 g total) by mouth 2 (two) times daily. 120 capsule 11  . omeprazole (PRILOSEC) 20 MG capsule TAKE 1 CAPSULE BY MOUTH ONCE A DAY 90 capsule 0  . LIVALO 2 MG TABS TAKE 1/2 TABLETS (1 MG TOTAL) BY MOUTH DAILY. (Patient not taking: Reported on 11/12/2019) 45 tablet 1   No facility-administered medications prior to visit.   ROS See HPI  Objective:  BP (!) 146/90 (BP Location: Right Arm, Patient Position: Sitting, Cuff Size: Normal)   Pulse 78   Temp (!) 97.3 F (36.3 C) (Temporal)   Ht 5\' 5"  (1.651 m)   Wt 273 lb (123.8 kg)   SpO2 98%   BMI 45.43 kg/m   Physical Exam Constitutional:      Appearance: He is obese.  HENT:     Nose:   Cardiovascular:     Rate and Rhythm: Normal rate and regular rhythm.     Pulses: Normal pulses.     Heart sounds: Normal heart sounds.  Pulmonary:     Effort: Pulmonary effort is normal.     Breath sounds: Normal breath sounds.  Musculoskeletal:     Right lower  leg: No edema.     Left lower leg: No edema.  Skin:    General: Skin is warm and dry.  Neurological:     Mental Status: He is alert and oriented to person, place, and time.    Assessment & Plan:  This visit occurred during the SARS-CoV-2 public health emergency.  Safety protocols were in place, including screening questions prior to the visit, additional usage of staff PPE, and extensive cleaning of exam room while observing appropriate contact time as indicated for disinfecting solutions.   Malakie was seen today for follow-up.  Diagnoses and all orders for this visit:  Essential hypertension -     Basic metabolic panel  Class 3 severe obesity due to excess calories with serious comorbidity and body  mass index (BMI) of 45.0 to 49.9 in adult [facility])  Mixed dyslipidemia -     Lipid Profile -     Hepatic function panel  Hyperglycemia -     Hemoglobin A1c  Atypical mole -     Ambulatory referral to Dermatology    Problem List Items Addressed This Visit      Cardiovascular and Mediastinum   HTN (hypertension) - Primary    BP stable but not at goal Non compliant with CPAP machine. BP Readings from Last 3 Encounters:  11/12/19 (!) 146/90  05/15/19 130/84  04/30/19 (!) 174/107   Repeat BMP Maintain current medications at this time      Relevant Orders   Basic metabolic panel     Other   Class 2 severe obesity due to excess calories with serious comorbidity and body mass index (BMI) of 36.0 to 36.9 in adult ([place])    13lbs weight gain in last 67months. Admits to non compliance with CPAP machine, diet and exercise Advised about the importance of maintaining DASH diet, decrease calorie intake, start walking 30-44mins a day, and use CPAP machine daily. Advised him about his risk for DM and CAD.  Repeat Hgb A1c Consider use of metformin?      Hyperglycemia   Relevant Orders   Hemoglobin A1c   Mixed dyslipidemia    He discontinued livalo due to joint pain. Repeat lipid panel today      Relevant Orders   Lipid Profile   Hepatic function panel    Other Visit Diagnoses    Atypical mole       Relevant Orders   Ambulatory referral to Dermatology      Follow-up: Return in about 6 months (around 05/14/2020) for HTN and , hyperlipidemia.  [name], NP

## 2019-11-12 NOTE — Assessment & Plan Note (Signed)
He discontinued livalo due to joint pain. Repeat lipid panel today

## 2019-11-12 NOTE — Assessment & Plan Note (Signed)
13lbs weight gain in last 68months. Admits to non compliance with CPAP machine, diet and exercise Advised about the importance of maintaining DASH diet, decrease calorie intake, start walking 30-41mins a day, and use CPAP machine daily. Advised him about his risk for DM and CAD.  Repeat Hgb A1c Consider use of metformin?

## 2019-11-12 NOTE — Patient Instructions (Addendum)
Go to lab for blood draw  It is important to maintain DASH diet, decrease calorie intake, start walking 30-14mins a day, and use CPAP machine daily.  You will be contacted to schedule appt with dermatology.  Let me know if you want referral to the weight loss clinic.

## 2019-11-12 NOTE — Assessment & Plan Note (Signed)
BP stable but not at goal Non compliant with CPAP machine. BP Readings from Last 3 Encounters:  11/12/19 (!) 146/90  05/15/19 130/84  04/30/19 (!) 174/107   Repeat BMP Maintain current medications at this time

## 2019-11-12 NOTE — Telephone Encounter (Signed)
Patient called and wanted to let Baldo Ash know that she is still talking the lavita. CB is [phone]

## 2019-11-22 NOTE — Telephone Encounter (Signed)
Patrick Levine this is just a FYI, Pt called an wanted to let you know that he hasn't discontinued the Livalo he is in fact still taking the Livalo an misinformed you.

## 2019-11-25 MED FILL — CANDESARTAN-HCTZ 32MG/12.5M: 32-12.5 | 90 days supply | Qty: 90 | Fill #1

## 2019-12-12 ENCOUNTER — Other Ambulatory Visit: Payer: Self-pay

## 2019-12-12 ENCOUNTER — Ambulatory Visit
Admission: EM | Admit: 2019-12-12 | Discharge: 2019-12-12 | Disposition: A | Discharge status: Home / Self Care | Payer: Managed Care, Other (non HMO) | Attending: Emergency Medicine | Admitting: Emergency Medicine

## 2019-12-12 ENCOUNTER — Encounter: Payer: Self-pay | Admitting: Emergency Medicine

## 2019-12-12 DIAGNOSIS — B349 Viral infection, unspecified: Secondary | ICD-10-CM | POA: Diagnosis not present

## 2019-12-12 MED ORDER — FLUTICASONE PROPIONATE 50 MCG/ACT NA SUSP: 1.0000 | Freq: Every day | NASAL | 0 refills | Status: AC

## 2019-12-12 MED ORDER — CETIRIZINE HCL 10 MG PO TABS: 10.0000 mg | ORAL_TABLET | Freq: Every day | ORAL | 0 refills | Status: AC

## 2019-12-12 NOTE — Discharge Instructions (Addendum)
Your COVID test is pending - it is important to quarantine / isolate at home until your results are back. °If you test positive and would like further evaluation for persistent or worsening symptoms, you may schedule an E-visit or virtual (video) visit throughout the [facility] MyChart app or website. ° °PLEASE NOTE: If you develop severe chest pain or shortness of breath please go to the ER or call 9-1-1 for further evaluation --> DO NOT schedule electronic or virtual visits for this. °Please call our office for further guidance / recommendations as needed. ° °For information about the Covid vaccine, please visit [email or web address] °

## 2019-12-12 NOTE — ED Triage Notes (Signed)
Patient c/o body aches, fatigue and nasal congestion that started Tuesday. Patient states he was tested for COVID yesterday at [facility] and this was negative.

## 2019-12-12 NOTE — ED Provider Notes (Signed)
[facility]    CSN: [id number] Arrival date & time: 12/12/19  1241      History   Chief Complaint Chief Complaint  Patient presents with  . Fatigue  . Nasal Congestion  . Generalized Body Aches    HPI Patrick Levine is a 44 y.o. male  Presenting for 3-day course of fatigue, myalgias, nasal congestion.  No cough, difficulty breathing, fever or chest pain.  States he underwent Covid testing yesterday: Negative.  Unable to view these records, nor does patient have on hand.  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  Not taking anything for symptoms.  Past Medical History:  Diagnosis Date  . Alcohol addiction ([place])   . High cholesterol   . Hypertension     Patient Active Problem List   Diagnosis Date Noted  . Polysubstance abuse ([place]) 08/17/2018  . Sleep apnea 08/17/2018  . Class 2 severe obesity due to excess calories with serious comorbidity and body mass index (BMI) of 36.0 to 36.9 in adult [facility]) 07/16/2018  . Mixed dyslipidemia 09/13/2017  . Cigarette smoker 09/13/2017  . Hyperglycemia 06/22/2017  . Laryngitis 05/19/2017  . Bilateral carpal tunnel syndrome 10/29/2015  . Numbness in both hands 03/25/2015  . 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 Fracture of ribs, multiple, closed 12/26/2011  . HTN (hypertension) 12/26/2011    Past Surgical History:  Procedure Laterality Date  . LACERATION REPAIR  2001   lip laceration--stitches.       Home Medications    Prior to Admission medications   Medication Sig Start Date End Date Taking? Authorizing Provider  amLODipine (NORVASC) 10 MG tablet TAKE 1 TABLET BY MOUTH DAILY. 09/27/19  Yes Nche, Charlene Brooke, NP  atenolol (TENORMIN) 25 MG tablet Take 0.5 tablets (12.5 mg total) by mouth 2 (two) times daily. 02/13/19  Yes Nche, Charlene Brooke, NP  candesartan-hydrochlorothiazide (ATACAND HCT) 32-12.5 MG tablet Take 1 tablet by  mouth daily. 02/13/19  Yes Nche, Charlene Brooke, NP  diphenhydrAMINE (BENADRYL) 25 mg capsule Take 1 capsule (25 mg total) by mouth every 8 (eight) hours as needed. 09/24/18  Yes Nche, Charlene Brooke, NP  EPINEPHrine 0.3 mg/0.3 mL IJ SOAJ injection Inject 0.3 mLs (0.3 mg total) into the muscle as needed for anaphylaxis. 07/16/18  Yes Nche, Charlene Brooke, NP  LIVALO 2 MG TABS TAKE 1/2 TABLETS (1 MG TOTAL) BY MOUTH DAILY. 09/27/19  Yes Nche, Charlene Brooke, NP  Multiple Vitamin (ONE-A-DAY MENS PO) Take 1 tablet by mouth.     Yes [provider]  omega-3 acid ethyl esters (LOVAZA) 1 g capsule Take 2 capsules (2 g total) by mouth 2 (two) times daily. 08/17/18  Yes Nche, Charlene Brooke, NP  omeprazole (PRILOSEC) 20 MG capsule TAKE 1 CAPSULE BY MOUTH ONCE A DAY 09/03/19  Yes Nche, Charlene Brooke, NP  cetirizine (ZYRTEC ALLERGY) 10 MG tablet Take 1 tablet (10 mg total) by mouth daily. 12/12/19   Hall-Potvin, [place], PA-C  fluticasone (FLONASE) 50 MCG/ACT nasal spray Place 1 spray into both nostrils daily. 12/12/19   Hall-Potvin, [place], PA-C    Family History Family History  Problem Relation Age of Onset  . Hypertension Father   . Hypertension Paternal Grandfather     Social History Social History   Tobacco Use  . Smoking status: Current Some Day Smoker    Packs/day: 0.50    Types: Cigarettes  . Smokeless tobacco: Never Used  Vaping Use  . Vaping Use: Every day  Substance Use Topics  . Alcohol use: Not Currently    Alcohol/week: 8.0 standard drinks    Types: 5 Cans of beer, 3 Shots of liquor per week    Comment: 6-8 drinks/day  . Drug use: Not Currently    Types: "Crack" cocaine     Allergies   Bee venom and Pravastatin sodium   Review of Systems As per HPI   Physical Exam Triage Vital Signs ED Triage Vitals  Enc Vitals Group     BP 12/12/19 1315 (!) 148/88     Pulse Rate 12/12/19 1315 77     Resp 12/12/19 1315 (!) 24     Temp 12/12/19 1315 98.2 F (36.8 C)     Temp  Source 12/12/19 1315 Oral     SpO2 12/12/19 1315 97 %     Weight 12/12/19 1323 245 lb (111.1 kg)     Height 12/12/19 1323 5\' 6"  (1.676 m)     Head Circumference --      Peak Flow --      Pain Score 12/12/19 1323 7     Pain Loc --      Pain Edu? --      Excl. in [place]? --    No data found.  Updated Vital Signs BP (!) 148/88 (BP Location: Left Arm)   Pulse 77   Temp 98.2 F (36.8 C) (Oral)   Resp (!) 24   Ht 5\' 6"  (1.676 m)   Wt 245 lb (111.1 kg)   SpO2 97%   BMI 39.54 kg/m   Visual Acuity Right Eye Distance:   Left Eye Distance:   Bilateral Distance:    Right Eye Near:   Left Eye Near:    Bilateral Near:     Physical Exam Constitutional:      General: He is not in acute distress. HENT:     Head: Normocephalic and atraumatic.  Eyes:     General: No scleral icterus.    Pupils: Pupils are equal, round, and reactive to light.  Cardiovascular:     Rate and Rhythm: Normal rate and regular rhythm.  Pulmonary:     Effort: Pulmonary effort is normal. No respiratory distress.     Breath sounds: No wheezing.  Skin:    Coloration: Skin is not jaundiced or pale.  Neurological:     Mental Status: He is alert and oriented to person, place, and time.      UC Treatments / Results  Labs (all labs ordered are listed, but only abnormal results are displayed) Labs Reviewed  NOVEL CORONAVIRUS, NAA    EKG   Radiology No results found.  Procedures Procedures (including critical care time)  Medications Ordered in UC Medications - No data to display  Initial Impression / Assessment and Plan / UC Course  I have reviewed the triage vital signs and the nursing notes.  Pertinent labs & imaging results that were available during my care of the patient were reviewed by me and considered in my medical decision making (see chart for details).     Patient afebrile, nontoxic, with SpO2 97%.  Possible false-negative early in course of illness -Covid PCR pending.  Patient to  quarantine until results are back.  We will treat supportively as outlined below.  Return precautions discussed, patient verbalized understanding and is agreeable to plan. Final Clinical Impressions(s) / UC Diagnoses   Final diagnoses:  Viral illness     Discharge  Instructions     Your COVID test is pending - it is important to quarantine / isolate at home until your results are back. If you test positive and would like further evaluation for persistent or worsening symptoms, you may schedule an E-visit or virtual (video) visit throughout the [facility] app or website.  PLEASE NOTE: If you develop severe chest pain or shortness of breath please go to the ER or call 9-1-1 for further evaluation --> DO NOT schedule electronic or virtual visits for this. Please call our office for further guidance / recommendations as needed.  For information about the Covid vaccine, please visit [email or web address]    ED Prescriptions    Medication Sig Dispense Auth. Provider   cetirizine (ZYRTEC ALLERGY) 10 MG tablet Take 1 tablet (10 mg total) by mouth daily. 30 tablet Hall-Potvin, [place], PA-C   fluticasone (FLONASE) 50 MCG/ACT nasal spray Place 1 spray into both nostrils daily. 16 g Hall-Potvin, [place], PA-C     PDMP not reviewed this encounter.   Hall-Potvin, [place], [place] 12/12/19 1431

## 2019-12-14 LAB — NOVEL CORONAVIRUS, NAA: SARS-CoV-2, NAA: NOT DETECTED

## 2019-12-14 LAB — SARS-COV-2, NAA 2 DAY TAT

## 2020-01-20 ENCOUNTER — Other Ambulatory Visit: Payer: Self-pay | Admitting: Nurse Practitioner

## 2020-01-20 DIAGNOSIS — K219 Gastro-esophageal reflux disease without esophagitis: Secondary | ICD-10-CM

## 2020-01-20 DIAGNOSIS — J040 Acute laryngitis: Secondary | ICD-10-CM

## 2020-01-20 DIAGNOSIS — E781 Pure hyperglyceridemia: Secondary | ICD-10-CM

## 2020-01-20 MED FILL — OMEGA-3-ACID ETHYL ESTERS 1: 1 | 30 days supply | Qty: 120 | Fill #0

## 2020-01-20 MED FILL — AMLODIPINE BESYLATE 10 MG T: 10 | 90 days supply | Qty: 90 | Fill #1

## 2020-01-20 MED FILL — OMEPRAZOLE 20 MG CAP: 20 | 90 days supply | Qty: 90 | Fill #0

## 2020-01-20 MED FILL — LIVALO 2 MG TABLET: 2 | 90 days supply | Qty: 45 | Fill #1

## 2020-03-06 ENCOUNTER — Other Ambulatory Visit: Payer: Self-pay | Admitting: Nurse Practitioner

## 2020-03-06 DIAGNOSIS — I10 Essential (primary) hypertension: Secondary | ICD-10-CM

## 2020-03-06 MED FILL — OMEGA-3-ACID ETHYL ESTERS 1: 1 | 30 days supply | Qty: 120 | Fill #0

## 2020-03-06 MED FILL — AMLODIPINE BESYLATE 10 MG T: 10 | 90 days supply | Qty: 90 | Fill #1

## 2020-03-06 MED FILL — OMEPRAZOLE 20 MG CAP: 20 | 90 days supply | Qty: 90 | Fill #0

## 2020-03-06 MED FILL — CANDESARTAN-HCTZ 32MG/12.5M: 32-12.5 | 90 days supply | Qty: 90 | Fill #0

## 2020-03-06 MED FILL — LIVALO 2 MG TABLET: 2 | 90 days supply | Qty: 45 | Fill #1

## 2020-03-06 NOTE — Telephone Encounter (Signed)
Last Ov 11/12/19 Last fill 02/13/19  #90/1

## 2020-04-15 ENCOUNTER — Other Ambulatory Visit: Payer: Self-pay

## 2020-04-15 ENCOUNTER — Encounter: Payer: Self-pay | Admitting: Physician Assistant

## 2020-04-15 ENCOUNTER — Ambulatory Visit (INDEPENDENT_AMBULATORY_CARE_PROVIDER_SITE_OTHER): Payer: Managed Care, Other (non HMO) | Admitting: Physician Assistant

## 2020-04-15 DIAGNOSIS — D485 Neoplasm of uncertain behavior of skin: Secondary | ICD-10-CM

## 2020-04-15 DIAGNOSIS — L820 Inflamed seborrheic keratosis: Secondary | ICD-10-CM

## 2020-04-15 DIAGNOSIS — D2239 Melanocytic nevi of other parts of face: Secondary | ICD-10-CM | POA: Diagnosis not present

## 2020-04-15 NOTE — Patient Instructions (Signed)

## 2020-04-15 NOTE — Progress Notes (Addendum)
° °  New Patient   Subjective  Patrick Levine is a 45 y.o. male who presents for the following: New Patient (Initial Visit) (Patient here today for mole removal on left side of nose and under left eye if possible. Per patient the moles do not bleed he just wants them removed.).   The following portions of the chart were reviewed this encounter and updated as appropriate:      Objective  Well appearing patient in no apparent distress; mood and affect are within normal limits.  A focused examination was performed including face. Relevant physical exam findings are noted in the Assessment and Plan.  Objective  Left Nasal Sidewall: Pink raised papule     Objective  Left Lower Eyelid (3): Erythematous stuck-on, waxy papule.    Assessment & Plan  Neoplasm of uncertain behavior of skin Left Nasal Sidewall  Skin / nail biopsy Type of biopsy: tangential   Informed consent: discussed and consent obtained   Timeout: patient name, date of birth, surgical site, and procedure verified   Procedure prep:  Patient was prepped and draped in usual sterile fashion (Non sterile) Prep type:  Chlorhexidine Anesthesia: the lesion was anesthetized in a standard fashion   Anesthetic:  1% lidocaine w/ epinephrine 1-100,000 local infiltration Instrument used: flexible razor blade   Outcome: patient tolerated procedure well   Post-procedure details: wound care instructions given   Additional details:  Cautery only  Specimen 1 - Surgical pathology Differential Diagnosis: r/o atypia, sk  Check Margins: No  Inflamed seborrheic keratosis (3) Left Lower Eyelid  Destruction of lesion - Left Lower Eyelid Complexity: simple   Destruction method comment:  Scissors were used to snip tag at the base Informed consent: discussed and consent obtained   Timeout:  patient name, date of birth, surgical site, and procedure verified Anesthesia: the lesion was anesthetized in a standard fashion    Hemostasis achieved with:  pressure Outcome: patient tolerated procedure well with no complications   Post-procedure details: wound care instructions given      I, Johnney Scarlata, PA-C, have reviewed all documentation for this visit. The documentation on 04/21/20 for the exam, diagnosis, procedures, and orders are all accurate and complete.

## 2020-04-16 ENCOUNTER — Ambulatory Visit: Payer: Managed Care, Other (non HMO) | Admitting: Physician Assistant

## 2020-04-27 NOTE — Progress Notes (Signed)
CARDIOLOGY OFFICE NOTE  Date:  04/29/2020    Patrick Levine Date of Birth: February 23, 1976 Medical Record #[id number]  PCP:  Flossie Buffy, NP  Cardiologist:  Revankar  Chief Complaint  Patient presents with  . Follow-up    Seen for Dr. Geraldo Pitter    History of Present Illness: Patrick Levine is a 45 y.o. male who presents today for a follow up visit. Seen for Dr. Geraldo Pitter.   Last seen here in November of 2020.   Has history of HTN, HLD and OSA. Noted alcohol and tobacco addictions .   Last seen in November of 2020 by Dr. Geraldo Pitter.  Cardiac CT scoring was recommended - do not see that this was completed.   Comes in today. Here alone. 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 Actually has not had for about 2 days. BP up. No chest pain. Not short of breath. Enjoys weight lifting. Some aerobic exercise done. He feels like he is ok. Smoking still. 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 Has not tolerated [place].   Past Medical History:  Diagnosis Date  . Alcohol addiction ([place])   . High cholesterol   . Hypertension     Past Surgical History:  Procedure Laterality Date  . LACERATION REPAIR  2001   lip laceration--stitches.     Medications: Current Meds  Medication Sig  . amLODipine (NORVASC) 10 MG tablet TAKE 1 TABLET BY MOUTH DAILY.  Marland Kitchen atenolol (TENORMIN) 25 MG tablet Take 0.5 tablets (12.5 mg total) by mouth 2 (two) times daily.  . cetirizine (ZYRTEC ALLERGY) 10 MG tablet Take 1 tablet (10 mg total) by mouth daily.  . diphenhydrAMINE (BENADRYL) 25 mg capsule Take 1 capsule (25 mg total) by mouth every 8 (eight) hours as needed.  Marland Kitchen EPINEPHrine 0.3 mg/0.3 mL IJ SOAJ injection Inject 0.3 mLs (0.3 mg total) into the muscle as needed for anaphylaxis.  . fluticasone (FLONASE) 50 MCG/ACT nasal spray Place 1 spray into both nostrils daily.  . Multiple Vitamin (ONE-A-DAY MENS PO) Take  1 tablet by mouth.  . omega-3 acid ethyl esters (LOVAZA) 1 g capsule TAKE 2 CAPSULES (2 G TOTAL) BY MOUTH 2 TIMES DAILY.  Marland Kitchen omeprazole (PRILOSEC) 20 MG capsule TAKE 1 CAPSULE BY MOUTH ONCE A DAY     Allergies: Allergies  Allergen Reactions  . Bee Venom Anaphylaxis  . Pravastatin Sodium Other (See Comments)    Muscle and joint aches    Social History: The patient  reports that he has been smoking cigarettes. He has been smoking about 0.50 packs per day. He has never used smokeless tobacco. He reports previous alcohol use of about 8.0 standard drinks of alcohol per week. He reports previous drug use. Drug: "Crack" cocaine.   Family History: The patient's family history includes Hypertension in his father and paternal grandfather.   Review of Systems: Please see the history of present illness.   All other systems are reviewed and negative.   Physical Exam: VS:  BP (!) 150/98 (BP Location: Right Arm, Patient Position: Sitting, Cuff Size: Large)   Pulse 66   Ht 5\' 5"  (1.651 m)   Wt 276 lb (125.2 kg)   BMI 45.93 kg/m  .  BMI Body mass index is 45.93 kg/m.  Wt Readings from Last 3 Encounters:  04/29/20 276 lb (125.2 kg)  12/12/19 245 lb (111.1 kg)  11/12/19  273 lb (123.8 kg)    General: Pleasant. Alert and in no acute distress. He is obese.  Cardiac: Regular rate and rhythm. No murmurs, rubs, or gallops. No edema.  Respiratory:  Lungs are clear to auscultation bilaterally with normal work of breathing.  GI: Soft and nontender.  MS: No deformity or atrophy. Gait and ROM intact.  Skin: Warm and dry. Color is normal.  Neuro:  Strength and sensation are intact and no gross focal deficits noted.  Psych: Alert, appropriate and with normal affect.   LABORATORY DATA:  EKG:  EKG is ordered today.  Personally reviewed by me. This demonstrates NSR with non specific ST changes. Q's in lead 3. Unchanged from prior tracing.   Lab Results  Component Value Date   WBC 6.1 08/17/2018    HGB 14.9 08/17/2018   HCT 43.9 08/17/2018   PLT 309.0 08/17/2018   GLUCOSE 117 (H) 11/12/2019   CHOL 229 (H) 11/12/2019   TRIG 321.0 (H) 11/12/2019   HDL 47.40 11/12/2019   LDLDIRECT 137.0 11/12/2019   LDLCALC 131 (H) 02/14/2019   ALT 20 11/12/2019   AST 15 11/12/2019   NA 138 11/12/2019   K 3.9 11/12/2019   CL 105 11/12/2019   CREATININE 0.88 11/12/2019   BUN 12 11/12/2019   CO2 25 11/12/2019   TSH 0.585 02/14/2019   HGBA1C 6.2 11/12/2019     BNP (last 3 results) No results for input(s): BNP in the last 8760 hours.  ProBNP (last 3 results) No results for input(s): PROBNP in the last 8760 hours.   Other Studies Reviewed Today:  GXT Study Highlights 09/2017    Blood pressure demonstrated a hypertensive response to exercise.  There was no ST segment deviation noted during stress.   Normal ETT HTN response to exercise     ASSESSMENT & PLAN:     1. HTN - not controlled - has been off his medicines for at least 2 days that he endorses - recommend that he restart. Explained the importance of good BP control.   2. OSA  3. Multi substance abuse - discussed. Seems to be trying to make changes.   4. Obesity - he is trying to work on his - less carbs and sugars recommended.   5. HLD - not on statin - recheck lab today - consider low dose Crestor  Current medicines are reviewed with the patient today.  The patient does not have concerns regarding medicines other than what has been noted above.  The following changes have been made:  See above.  Labs/ tests ordered today include:    Orders Placed This Encounter  Procedures  . Basic metabolic panel  . Lipid panel  . Hepatic function panel  . EKG 12-Lead     Disposition:   FU with Dr. Geraldo Pitter in 6 months.     Patient is agreeable to this plan and will call if any problems develop in the interim.   SignedTruitt Merle, NP  04/29/2020 11:27 AM  [name] [address] [place], [place]  [address] Phone: [phone] Fax: [phone]

## 2020-04-29 ENCOUNTER — Ambulatory Visit (INDEPENDENT_AMBULATORY_CARE_PROVIDER_SITE_OTHER): Payer: Managed Care, Other (non HMO) | Admitting: Nurse Practitioner

## 2020-04-29 ENCOUNTER — Other Ambulatory Visit: Payer: Self-pay

## 2020-04-29 ENCOUNTER — Encounter: Payer: Self-pay | Admitting: Nurse Practitioner

## 2020-04-29 VITALS — BP 150/98 | HR 66 | Ht 65.0 in | Wt 276.0 lb

## 2020-04-29 DIAGNOSIS — I10 Essential (primary) hypertension: Secondary | ICD-10-CM

## 2020-04-29 DIAGNOSIS — G4730 Sleep apnea, unspecified: Secondary | ICD-10-CM | POA: Diagnosis not present

## 2020-04-29 DIAGNOSIS — F17210 Nicotine dependence, cigarettes, uncomplicated: Secondary | ICD-10-CM

## 2020-04-29 DIAGNOSIS — E782 Mixed hyperlipidemia: Secondary | ICD-10-CM | POA: Diagnosis not present

## 2020-04-29 NOTE — Patient Instructions (Addendum)
After Visit Summary:  We will be checking the following labs today - BMET, Lipids and LFTs   Medication Instructions:    Continue with your current medicines.   Get back on your medicines.    If you need a refill on your cardiac medications before your next appointment, please call your pharmacy.     Testing/Procedures To Be Arranged:  N/A  Follow-Up:   See Dr. Geraldo Pitter in 6 months - * You will receive a reminder letter in the mail two months in advance. If you don't receive a letter, please call our office to schedule the follow-up appointment.     At [facility], you and your health needs are our priority.  As part of our continuing mission to provide you with exceptional heart care, we have created designated Provider Care Teams.  These Care Teams include your primary Cardiologist (physician) and Advanced Practice Providers (APPs -  Physician Assistants and Nurse Practitioners) who all work together to provide you with the care you need, when you need it.  Special Instructions:  . Stay safe, wash your hands for at least 20 seconds and wear a mask when needed.  . It was good to talk with you today. . Think about what we talked about today.    Call the [place] office at [phone] if you have any questions, problems or concerns.

## 2020-04-30 LAB — BASIC METABOLIC PANEL
BUN/Creatinine Ratio: 13 (ref 9–20)
BUN: 10 mg/dL (ref 6–24)
CO2: 21 mmol/L (ref 20–29)
Calcium: 9.9 mg/dL (ref 8.7–10.2)
Chloride: 102 mmol/L (ref 96–106)
Creatinine, Ser: 0.79 mg/dL (ref 0.76–1.27)
GFR calc Af Amer: 125 mL/min/{1.73_m2} (ref >59)
GFR calc non Af Amer: 108 mL/min/{1.73_m2} (ref >59)
Glucose: 112 mg/dL — ABNORMAL HIGH (ref 65–99)
Potassium: 4.1 mmol/L (ref 3.5–5.2)
Sodium: 136 mmol/L (ref 134–144)

## 2020-04-30 LAB — LIPID PANEL
Chol/HDL Ratio: 4.5 ratio (ref 0.0–5.0)
Cholesterol, Total: 232 mg/dL — ABNORMAL HIGH (ref 100–199)
HDL: 52 mg/dL (ref >39)
LDL Chol Calc (NIH): 130 mg/dL — ABNORMAL HIGH (ref 0–99)
Triglycerides: 281 mg/dL — ABNORMAL HIGH (ref 0–149)
VLDL Cholesterol Cal: 50 mg/dL — ABNORMAL HIGH (ref 5–40)

## 2020-04-30 LAB — HEPATIC FUNCTION PANEL
ALT: 28 IU/L (ref 0–44)
AST: 14 IU/L (ref 0–40)
Albumin: 4.4 g/dL (ref 4.0–5.0)
Alkaline Phosphatase: 75 IU/L (ref 44–121)
Bilirubin Total: <0.2 mg/dL (ref 0.0–1.2)
Bilirubin, Direct: <0.1 mg/dL (ref 0.00–0.40)
Total Protein: 7.1 g/dL (ref 6.0–8.5)

## 2020-05-05 ENCOUNTER — Other Ambulatory Visit: Payer: Self-pay | Admitting: *Deleted

## 2020-05-05 DIAGNOSIS — E782 Mixed hyperlipidemia: Secondary | ICD-10-CM

## 2020-05-05 DIAGNOSIS — I10 Essential (primary) hypertension: Secondary | ICD-10-CM

## 2020-05-05 MED ORDER — ROSUVASTATIN CALCIUM 5 MG PO TABS: 5.0000 mg | ORAL_TABLET | Freq: Every day | ORAL | 3 refills | Status: AC

## 2020-05-05 MED FILL — ROSUVASTATIN CALCIUM 5 MG T: 5 | 90 days supply | Qty: 90 | Fill #0

## 2020-05-14 ENCOUNTER — Other Ambulatory Visit: Payer: Self-pay

## 2020-05-15 ENCOUNTER — Ambulatory Visit (INDEPENDENT_AMBULATORY_CARE_PROVIDER_SITE_OTHER): Payer: Managed Care, Other (non HMO) | Admitting: Nurse Practitioner

## 2020-05-15 ENCOUNTER — Encounter: Payer: Self-pay | Admitting: Nurse Practitioner

## 2020-05-15 VITALS — BP 136/86 | HR 70 | Temp 97.3°F | Ht 65.0 in | Wt 275.0 lb

## 2020-05-15 DIAGNOSIS — Z6841 Body Mass Index (BMI) 40.0 and over, adult: Secondary | ICD-10-CM

## 2020-05-15 DIAGNOSIS — I10 Essential (primary) hypertension: Secondary | ICD-10-CM | POA: Diagnosis not present

## 2020-05-15 DIAGNOSIS — Z1211 Encounter for screening for malignant neoplasm of colon: Secondary | ICD-10-CM | POA: Diagnosis not present

## 2020-05-15 MED ORDER — AMLODIPINE BESYLATE 10 MG PO TABS: 10.0000 mg | ORAL_TABLET | Freq: Every day | ORAL | 3 refills | Status: AC

## 2020-05-15 MED ORDER — ATENOLOL 25 MG PO TABS: 12.5000 mg | ORAL_TABLET | Freq: Two times a day (BID) | ORAL | 3 refills | Status: AC

## 2020-05-15 MED FILL — ATENOLOL 25 MG TABLET: 25 | 90 days supply | Qty: 90 | Fill #0

## 2020-05-15 NOTE — Assessment & Plan Note (Signed)
Entered referral to weight management clinic

## 2020-05-15 NOTE — Progress Notes (Signed)
Subjective:  Patient ID: Patrick Levine, male    DOB: 07-27-1975  Age: 45 y.o. MRN: [id number]  CC: Follow-up (6 month f/u on HTN and hyperlipidemia.)  HPI  HTN (hypertension) BP at goal BP Readings from Last 3 Encounters:  05/15/20 136/86  04/29/20 (!) 150/98  12/12/19 (!) 148/88   Maintain current medication  Obesity Entered referral to weight management clinic  Wt Readings from Last 3 Encounters:  05/15/20 275 lb (124.7 kg)  04/29/20 276 lb (125.2 kg)  12/12/19 245 lb (111.1 kg)   Reviewed past Medical, Social and Family history today.  Outpatient Medications Prior to Visit  Medication Sig Dispense Refill  . cetirizine (ZYRTEC ALLERGY) 10 MG tablet Take 1 tablet (10 mg total) by mouth daily. 30 tablet 0  . diphenhydrAMINE (BENADRYL) 25 mg capsule Take 1 capsule (25 mg total) by mouth every 8 (eight) hours as needed. 30 capsule 0  . EPINEPHrine 0.3 mg/0.3 mL IJ SOAJ injection Inject 0.3 mLs (0.3 mg total) into the muscle as needed for anaphylaxis. 1 Device 1  . fluticasone (FLONASE) 50 MCG/ACT nasal spray Place 1 spray into both nostrils daily. 16 g 0  . Multiple Vitamin (ONE-A-DAY MENS PO) Take 1 tablet by mouth.    . omega-3 acid ethyl esters (LOVAZA) 1 g capsule TAKE 2 CAPSULES (2 G TOTAL) BY MOUTH 2 TIMES DAILY. 120 capsule 3  . omeprazole (PRILOSEC) 20 MG capsule TAKE 1 CAPSULE BY MOUTH ONCE A DAY 90 capsule 0  . amLODipine (NORVASC) 10 MG tablet TAKE 1 TABLET BY MOUTH DAILY. 90 tablet 3  . atenolol (TENORMIN) 25 MG tablet Take 0.5 tablets (12.5 mg total) by mouth 2 (two) times daily. 90 tablet 1  . rosuvastatin (CRESTOR) 5 MG tablet Take 1 tablet (5 mg total) by mouth daily. (Patient not taking: Reported on 05/15/2020) 90 tablet 3   No facility-administered medications prior to visit.    ROS See HPI  Objective:  BP 136/86 (BP Location: Left Arm, Patient Position: Sitting, Cuff Size: Large)   Pulse 70   Temp (!) 97.3 F (36.3 C) (Temporal)   Ht 5\' 5"   (1.651 m)   Wt 275 lb (124.7 kg)   SpO2 97%   BMI 45.76 kg/m   Physical Exam Constitutional:      Appearance: He is obese.  Cardiovascular:     Rate and Rhythm: Normal rate and regular rhythm.     Pulses: Normal pulses.     Heart sounds: Normal heart sounds.  Pulmonary:     Effort: Pulmonary effort is normal.     Breath sounds: Normal breath sounds.  Musculoskeletal:     Right lower leg: No edema.     Left lower leg: No edema.  Neurological:     Mental Status: He is alert and oriented to person, place, and time.    Assessment & Plan:  This visit occurred during the SARS-CoV-2 public health emergency.  Safety protocols were in place, including screening questions prior to the visit, additional usage of staff PPE, and extensive cleaning of exam room while observing appropriate contact time as indicated for disinfecting solutions.   Patrick Levine was seen today for follow-up.  Diagnoses and all orders for this visit:  Primary hypertension  Class 3 severe obesity due to excess calories with serious comorbidity and body mass index (BMI) of 45.0 to 49.9 in adult (HCC) -     Amb Ref to Medical Weight Management  Colon cancer screening -  Ambulatory referral to Gastroenterology  Essential hypertension -     atenolol (TENORMIN) 25 MG tablet; Take 0.5 tablets (12.5 mg total) by mouth 2 (two) times daily. -     amLODipine (NORVASC) 10 MG tablet; Take 1 tablet (10 mg total) by mouth daily.   Problem List Items Addressed This Visit      Cardiovascular and Mediastinum   HTN (hypertension) - Primary    BP at goal BP Readings from Last 3 Encounters:  05/15/20 136/86  04/29/20 (!) 150/98  12/12/19 (!) 148/88   Maintain current medication      Relevant Medications   atenolol (TENORMIN) 25 MG tablet   amLODipine (NORVASC) 10 MG tablet     Other   Obesity    Entered referral to weight management clinic      Relevant Orders   Amb Ref to Medical Weight Management    Other  Visit Diagnoses    Colon cancer screening       Relevant Orders   Ambulatory referral to Gastroenterology   Essential hypertension       Relevant Medications   atenolol (TENORMIN) 25 MG tablet   amLODipine (NORVASC) 10 MG tablet      Follow-up: Return in about 6 months (around 11/12/2020) for CPE (fasting).  [name], NP

## 2020-05-15 NOTE — Assessment & Plan Note (Signed)
BP at goal BP Readings from Last 3 Encounters:  05/15/20 136/86  04/29/20 (!) 150/98  12/12/19 (!) 148/88   Maintain current medication

## 2020-05-15 NOTE — Patient Instructions (Addendum)
Cardiology: [phone]  You will be contacted to schedule appt with weight management clinic and GI

## 2020-05-29 ENCOUNTER — Telehealth: Payer: Self-pay | Admitting: Cardiology

## 2020-05-29 NOTE — Telephone Encounter (Signed)
I spoke to the patient and he will diet exercise and recheck in 67mo

## 2020-05-29 NOTE — Telephone Encounter (Signed)
He is not my patient Note said Dr Geraldo Pitter

## 2020-05-29 NOTE — Telephone Encounter (Signed)
Pt c/o medication issue:  1. Name of Medication: rosuvastatin (CRESTOR) 5 MG tablet  2. How are you currently taking this medication (dosage and times per day)? Patient not taking  3. Are you having a reaction (difficulty breathing--STAT)? Joint pain  4. What is your medication issue? Patient stopped taking the medication ~ 2 weeks ago. He had intense joint pain and headaches. He states his joints on his R hand still hurt even after stopping the medication. He did not have this problem prior to starting the medication. He is not sure what to do. Please advise

## 2020-06-17 ENCOUNTER — Other Ambulatory Visit: Payer: Managed Care, Other (non HMO)

## 2020-06-19 ENCOUNTER — Emergency Department (HOSPITAL_COMMUNITY): Payer: Managed Care, Other (non HMO)

## 2020-06-19 ENCOUNTER — Other Ambulatory Visit (HOSPITAL_BASED_OUTPATIENT_CLINIC_OR_DEPARTMENT_OTHER): Payer: Self-pay

## 2020-06-19 ENCOUNTER — Emergency Department (HOSPITAL_COMMUNITY)
Admission: EM | Admit: 2020-06-19 | Discharge: 2020-06-26 | Disposition: E | Payer: Managed Care, Other (non HMO) | Attending: Emergency Medicine | Admitting: Emergency Medicine

## 2020-06-19 DIAGNOSIS — Z79899 Other long term (current) drug therapy: Secondary | ICD-10-CM | POA: Diagnosis not present

## 2020-06-19 DIAGNOSIS — I10 Essential (primary) hypertension: Secondary | ICD-10-CM | POA: Insufficient documentation

## 2020-06-19 DIAGNOSIS — I639 Cerebral infarction, unspecified: Secondary | ICD-10-CM | POA: Insufficient documentation

## 2020-06-19 DIAGNOSIS — F17210 Nicotine dependence, cigarettes, uncomplicated: Secondary | ICD-10-CM | POA: Diagnosis not present

## 2020-06-19 DIAGNOSIS — I469 Cardiac arrest, cause unspecified: Secondary | ICD-10-CM

## 2020-06-19 LAB — I-STAT ARTERIAL BLOOD GAS, ED
Acid-base deficit: 11 mmol/L — ABNORMAL HIGH (ref 0.0–2.0)
Bicarbonate: 22 mmol/L (ref 20.0–28.0)
Calcium, Ion: 1.09 mmol/L — ABNORMAL LOW (ref 1.15–1.40)
HCT: 33 % — ABNORMAL LOW (ref 39.0–52.0)
Hemoglobin: 11.2 g/dL — ABNORMAL LOW (ref 13.0–17.0)
O2 Saturation: 70 %
Patient temperature: 96.6
Potassium: 4 mmol/L (ref 3.5–5.1)
Sodium: 148 mmol/L — ABNORMAL HIGH (ref 135–145)
TCO2: 25 mmol/L (ref 22–32)
pCO2 arterial: 91.7 mmHg (ref 32.0–48.0)
pH, Arterial: 6.98 — CL (ref 7.350–7.450)
pO2, Arterial: 54 mmHg — ABNORMAL LOW (ref 83.0–108.0)

## 2020-06-19 LAB — AMMONIA: Ammonia: 181 umol/L — ABNORMAL HIGH (ref 9–35)

## 2020-06-19 LAB — BETA-HYDROXYBUTYRIC ACID: Beta-Hydroxybutyric Acid: 0.29 mmol/L — ABNORMAL HIGH (ref 0.05–0.27)

## 2020-06-19 LAB — COMPREHENSIVE METABOLIC PANEL
ALT: 225 U/L — ABNORMAL HIGH (ref 0–44)
AST: 308 U/L — ABNORMAL HIGH (ref 15–41)
Albumin: 3.2 g/dL — ABNORMAL LOW (ref 3.5–5.0)
Alkaline Phosphatase: 79 U/L (ref 38–126)
Anion gap: 21 — ABNORMAL HIGH (ref 5–15)
BUN: 18 mg/dL (ref 6–20)
CO2: 16 mmol/L — ABNORMAL LOW (ref 22–32)
Calcium: 8.9 mg/dL (ref 8.9–10.3)
Chloride: 109 mmol/L (ref 98–111)
Creatinine, Ser: 3.73 mg/dL — ABNORMAL HIGH (ref 0.61–1.24)
GFR, Estimated: 19 mL/min — ABNORMAL LOW (ref >60)
Glucose, Bld: 61 mg/dL — ABNORMAL LOW (ref 70–99)
Potassium: 5 mmol/L (ref 3.5–5.1)
Sodium: 146 mmol/L — ABNORMAL HIGH (ref 135–145)
Total Bilirubin: 0.3 mg/dL (ref 0.3–1.2)
Total Protein: 6 g/dL — ABNORMAL LOW (ref 6.5–8.1)

## 2020-06-19 LAB — CBC WITH DIFFERENTIAL/PLATELET
Abs Immature Granulocytes: 0.82 10*3/uL — ABNORMAL HIGH (ref 0.00–0.07)
Basophils Absolute: 0.1 10*3/uL (ref 0.0–0.1)
Basophils Relative: 1 %
Eosinophils Absolute: 0.1 10*3/uL (ref 0.0–0.5)
Eosinophils Relative: 0 %
HCT: 44.2 % (ref 39.0–52.0)
Hemoglobin: 13.3 g/dL (ref 13.0–17.0)
Immature Granulocytes: 6 %
Lymphocytes Relative: 38 %
Lymphs Abs: 5.4 10*3/uL — ABNORMAL HIGH (ref 0.7–4.0)
MCH: 30 pg (ref 26.0–34.0)
MCHC: 30.1 g/dL (ref 30.0–36.0)
MCV: 99.5 fL (ref 80.0–100.0)
Monocytes Absolute: 1.4 10*3/uL — ABNORMAL HIGH (ref 0.1–1.0)
Monocytes Relative: 10 %
Neutro Abs: 6.4 10*3/uL (ref 1.7–7.7)
Neutrophils Relative %: 45 %
Platelets: 194 10*3/uL (ref 150–400)
RBC: 4.44 MIL/uL (ref 4.22–5.81)
RDW: 14 % (ref 11.5–15.5)
WBC: 14.1 10*3/uL — ABNORMAL HIGH (ref 4.0–10.5)
nRBC: 1.3 % — ABNORMAL HIGH (ref 0.0–0.2)

## 2020-06-19 LAB — TROPONIN I (HIGH SENSITIVITY): Troponin I (High Sensitivity): 428 ng/L (ref <18)

## 2020-06-19 LAB — D-DIMER, QUANTITATIVE: D-Dimer, Quant: 5.6 ug/mL-FEU — ABNORMAL HIGH (ref 0.00–0.50)

## 2020-06-19 LAB — PROTIME-INR
INR: 1.8 — ABNORMAL HIGH (ref 0.8–1.2)
Prothrombin Time: 20.1 seconds — ABNORMAL HIGH (ref 11.4–15.2)

## 2020-06-19 LAB — ETHANOL: Alcohol, Ethyl (B): <10 mg/dL (ref <10)

## 2020-06-19 MED ORDER — EPINEPHRINE 1 MG/10ML IJ SOSY
PREFILLED_SYRINGE | INTRAMUSCULAR | Status: AC | PRN
  Administered 2020-06-19 (×3): 1 mg via INTRAVENOUS

## 2020-06-19 MED ORDER — SODIUM CHLORIDE 0.9 % IV SOLN
INTRAVENOUS | Status: AC | PRN
  Administered 2020-06-19: 1000 mL via INTRAVENOUS

## 2020-06-19 MED ORDER — SODIUM BICARBONATE 8.4 % IV SOLN
INTRAVENOUS | Status: DC
  Filled 2020-06-19 (×2): qty 850

## 2020-06-19 MED ORDER — EPINEPHRINE 1 MG/10ML IJ SOSY
PREFILLED_SYRINGE | INTRAMUSCULAR | Status: AC | PRN
  Administered 2020-06-19 (×2): 1 mg via INTRAVENOUS

## 2020-06-19 MED ORDER — SODIUM BICARBONATE 8.4 % IV SOLN
INTRAVENOUS | Status: AC | PRN
  Administered 2020-06-19 (×2): 100 meq via INTRAVENOUS

## 2020-06-19 MED ORDER — IOHEXOL 350 MG/ML SOLN
100.0000 mL | Freq: Once | INTRAVENOUS | Status: AC | PRN
  Administered 2020-06-19: 100 mL via INTRAVENOUS

## 2020-06-19 MED ORDER — SODIUM BICARBONATE 8.4 % IV SOLN
INTRAVENOUS | Status: AC | PRN
Start: 2020-06-19 — End: 2020-06-19
  Administered 2020-06-19 (×2): 100 meq via INTRAVENOUS

## 2020-06-19 MED ORDER — EPINEPHRINE 0.1 MG/10ML (10 MCG/ML) SYRINGE FOR IV PUSH (FOR BLOOD PRESSURE SUPPORT)
PREFILLED_SYRINGE | INTRAVENOUS | Status: AC | PRN
  Administered 2020-06-19: 20 ug via INTRAVENOUS

## 2020-06-19 MED ORDER — EPINEPHRINE 1 MG/10ML IJ SOSY
PREFILLED_SYRINGE | INTRAMUSCULAR | Status: AC | PRN
  Administered 2020-06-19: 1 mg via INTRAVENOUS

## 2020-06-26 NOTE — ED Triage Notes (Signed)
Pt arrived via EMS. Per EMS pt last seen normal 3-4 hours prior to arrest. EMS stated pt was watching game with family member then went to sleep. Pt was heard snoring an when family went to check back on them they were unresponsive. Family performed about 5 mins of CPR on pt prior to arrival of EMS. Per EMS pt had heavy ETOH tonight. Pt in PEA while in ambulance but gained pulse when coming though hospital doors.    8 epi, 800ML NSS, 135BS

## 2020-06-26 NOTE — Progress Notes (Signed)
Chaplain responded to this CPR in progress.  Chaplain escorted wife, to met with the physician.  Chaplain offered support for his wife as she was very upset and distraught.  Chaplain and team provided support including hospitality.  Patient died.  MD connected with the family and they came bedside.  The children and patient's parents were grieving at the loss of what they described as the rock of the family.  Chaplain offered prayer and ministry of presence for each family member.  Chaplain explained the patient placement card to the patient's wife.   [place], [place].    07/16/20 [phone]  Clinical Encounter Type  Visited With Patient;Family;Health care provider  Visit Type Code;Death  Referral From Nurse  Consult/Referral To Chaplain  Spiritual Encounters  Spiritual Needs Grief support;Emotional;Prayer  Stress Factors  Family Stress Factors Loss

## 2020-06-26 NOTE — Code Documentation (Signed)
Epi drip titrated to 34mcg

## 2020-06-26 NOTE — ED Provider Notes (Signed)
[facility] EMERGENCY DEPARTMENT Provider Note   CSN: [id number] Arrival date & time: 2020-07-19  4401     History Chief Complaint  Patient presents with  . Cardiac Arrest    Patrick Levine is a 45 y.o. male.  The emergency department by ambulance as a code.  Patient was watching a basketball game with a relative the last he was seen.  Relative reports that he came back into the room at some point and found the patient slumped over, not responding.  He was reportedly having "snoring respirations".  Family member initiated CPR.  EMS report that they have continued CPR throughout transport.  He has had multiple rhythm changes from asystole to PEA to sinus rhythm with ROSC just prior to arrival at the ER. Level V Caveat due to acuity.        Past Medical History:  Diagnosis Date  . Alcohol addiction ([place])   . High cholesterol   . Hypertension     Patient Active Problem List   Diagnosis Date Noted  . Polysubstance abuse ([place]) 08/17/2018  . Sleep apnea 08/17/2018  . Obesity 07/16/2018  . Mixed dyslipidemia 09/13/2017  . Cigarette smoker 09/13/2017  . Hyperglycemia 06/22/2017  . Laryngitis 05/19/2017  . Bilateral carpal tunnel syndrome 10/29/2015  . Numbness in both hands 03/25/2015  . 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 Fracture of ribs, multiple, closed 12/26/2011  . HTN (hypertension) 12/26/2011    Past Surgical History:  Procedure Laterality Date  . LACERATION REPAIR  2001   lip laceration--stitches.       Family History  Problem Relation Age of Onset  . Hypertension Father   . Hypertension Paternal Grandfather     Social History   Tobacco Use  . Smoking status: Current Some Day Smoker    Packs/day: 0.50    Types: Cigarettes  . Smokeless tobacco: Never Used  Vaping Use  . Vaping Use: Every day  Substance Use Topics  . Alcohol use: Not Currently    Alcohol/week: 8.0 standard  drinks    Types: 5 Cans of beer, 3 Shots of liquor per week    Comment: 6-8 drinks/day  . Drug use: Not Currently    Types: "Crack" cocaine    Home Medications Prior to Admission medications   Medication Sig Start Date End Date Taking? Authorizing Provider  amLODipine (NORVASC) 10 MG tablet Take 1 tablet (10 mg total) by mouth daily. 05/15/20   Nche, Charlene Brooke, NP  atenolol (TENORMIN) 25 MG tablet Take 0.5 tablets (12.5 mg total) by mouth 2 (two) times daily. 05/15/20   Nche, Charlene Brooke, NP  cetirizine (ZYRTEC ALLERGY) 10 MG tablet Take 1 tablet (10 mg total) by mouth daily. 12/12/19   Hall-Potvin, [place], PA-C  diphenhydrAMINE (BENADRYL) 25 mg capsule Take 1 capsule (25 mg total) by mouth every 8 (eight) hours as needed. 09/24/18   Nche, Charlene Brooke, NP  EPINEPHrine 0.3 mg/0.3 mL IJ SOAJ injection Inject 0.3 mLs (0.3 mg total) into the muscle as needed for anaphylaxis. 07/16/18   Nche, Charlene Brooke, NP  fluticasone (FLONASE) 50 MCG/ACT nasal spray Place 1 spray into both nostrils daily. 12/12/19   Hall-Potvin, [place], PA-C  Multiple Vitamin (ONE-A-DAY MENS PO) Take 1 tablet by mouth.    [provider]  omega-3 acid ethyl esters (LOVAZA) 1 g capsule TAKE 2 CAPSULES (2 G TOTAL) BY MOUTH 2 TIMES DAILY. 01/20/20   Nche, Charlene Brooke, NP  omeprazole (PRILOSEC) 20 MG capsule TAKE 1 CAPSULE BY MOUTH ONCE A DAY 01/20/20   Nche, Charlene Brooke, NP  rosuvastatin (CRESTOR) 5 MG tablet Take 1 tablet (5 mg total) by mouth daily. Patient not taking: Reported on 05/15/2020 05/05/20 08/03/20  Revankar, Reita Cliche, MD    Allergies    Bee venom and Pravastatin sodium  Review of Systems   Review of Systems  Unable to perform ROS: Acuity of condition    Physical Exam Updated Vital Signs BP (!) 45/33   Pulse (!) 0   Temp (!) 96.6 F (35.9 C) (Oral)   Resp (!) 0   Ht 5\' 5"  (1.651 m)   Wt 124.7 kg   SpO2 (!) 38%   BMI 45.75 kg/m   Physical Exam Constitutional:      General: He is in  acute distress.     Appearance: He is ill-appearing.  HENT:     Head: Atraumatic.  Eyes:     Comments: Mid, fixed  Cardiovascular:     Rate and Rhythm: Regular rhythm.  Pulmonary:     Comments: Coarse, bilat - bagged via [place] Abdominal:     General: There is distension.  Musculoskeletal:        General: No deformity.     Cervical back: Neck supple.  Skin:    General: Skin is warm.  Neurological:     Comments: unresponsive     ED Results / Procedures / Treatments   Labs (all labs ordered are listed, but only abnormal results are displayed) Labs Reviewed  CBC WITH DIFFERENTIAL/PLATELET - Abnormal; Notable for the following components:      Result Value   WBC 14.1 (*)    nRBC 1.3 (*)    Lymphs Abs 5.4 (*)    Monocytes Absolute 1.4 (*)    Abs Immature Granulocytes 0.82 (*)    All other components within normal limits  COMPREHENSIVE METABOLIC PANEL - Abnormal; Notable for the following components:   Sodium 146 (*)    CO2 16 (*)    Glucose, Bld 61 (*)    Creatinine, Ser 3.73 (*)    Total Protein 6.0 (*)    Albumin 3.2 (*)    AST 308 (*)    ALT 225 (*)    GFR, Estimated 19 (*)    Anion gap 21 (*)    All other components within normal limits  BETA-HYDROXYBUTYRIC ACID - Abnormal; Notable for the following components:   Beta-Hydroxybutyric Acid 0.29 (*)    All other components within normal limits  AMMONIA - Abnormal; Notable for the following components:   Ammonia 181 (*)    All other components within normal limits  PROTIME-INR - Abnormal; Notable for the following components:   Prothrombin Time 20.1 (*)    INR 1.8 (*)    All other components within normal limits  D-DIMER, QUANTITATIVE - Abnormal; Notable for the following components:   D-Dimer, Quant 5.60 (*)    All other components within normal limits  I-STAT ARTERIAL BLOOD GAS, ED - Abnormal; Notable for the following components:   pH, Arterial 6.980 (*)    pCO2 arterial 91.7 (*)    pO2, Arterial 54  (*)    Acid-base deficit 11.0 (*)    Sodium 148 (*)    Calcium, Ion 1.09 (*)    HCT 33.0 (*)    Hemoglobin 11.2 (*)    All other components within normal limits  TROPONIN I (HIGH SENSITIVITY) - Abnormal; Notable for the  following components:   Troponin I (High Sensitivity) 428 (*)    All other components within normal limits  ETHANOL  LACTIC ACID, PLASMA  RAPID URINE DRUG SCREEN, HOSP PERFORMED  BLOOD GAS, ARTERIAL  I-STAT CHEM 8, ED  TROPONIN I (HIGH SENSITIVITY)    EKG None  Radiology CT HEAD WO CONTRAST  Result Date: 07-01-2020 CLINICAL DATA:  Mental status change.  Unresponsive. EXAM: CT HEAD WITHOUT CONTRAST TECHNIQUE: Contiguous axial images were obtained from the base of the skull through the vertex without intravenous contrast. COMPARISON:  09/01/2015 FINDINGS: Brain: Generalized brain swelling with loss of sulci and basal cisterns paired to prior. There is crowding at the foramen magnum and tentorial incisura. A discrete infarct appearance is seen along the right parietal cortex. No hemorrhage, hydrocephalus, or collection. Vascular: No hyperdense vessel when allowing for presumed adjacent brain edema. Skull: Negative Sinuses/Orbits: Partial opacification of the left maxillary sinus, chronic. IMPRESSION: 1. Generalized brain swelling with downward central descent and crowding at the foramen magnum. 2. 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 Electronically Signed   By: Monte Fantasia M.D.   On: 2020/07/01 06:41   CT ANGIO CHEST PE W OR WO CONTRAST  Result Date: 07/01/2020 CLINICAL DATA:  High probability for pulmonary embolism. EXAM: CT ANGIOGRAPHY CHEST WITH CONTRAST TECHNIQUE: Multidetector CT imaging of the chest was performed using the standard protocol during bolus administration of intravenous contrast. Multiplanar CT image reconstructions and MIPs were obtained to evaluate the vascular anatomy. CONTRAST:  130mL OMNIPAQUE IOHEXOL 350 MG/ML SOLN  COMPARISON:  09/11/2017 FINDINGS: Cardiovascular: Satisfactory opacification of the pulmonary arteries to the segmental level. No evidence of pulmonary embolism. Enlarged heart size. No pericardial effusion. Mediastinum/Nodes: Negative for adenopathy or mass. Endotracheal tube which terminates above the carina. The enteric tube tip terminates in the mid to lower thoracic esophagus. This finding has already been called; will not repeat call report to avoid two manipulations without interval imaging. Intravenous gas attributed to IV access. Lungs/Pleura: Dense dependent airspace opacity with volume loss. No superimposed edema, effusion, or air leak. Upper Abdomen: Negative Musculoskeletal: Anterior right 6 rib fracture which is nondisplaced. Possible cartilage fracture the anterior left seventh rib. Spondylosis. Review of the MIP images confirms the above findings. IMPRESSION: 1. Negative for pulmonary embolism. 2. Extensive airspace disease with aspiration pattern. 3. Cardiomegaly. 4. Nondisplaced right anterior sixth rib fracture. Probable left seventh rib cartilage fracture. 5. As noted on preceding chest x-ray the enteric tube tip is at the thoracic esophagus. Electronically Signed   By: Monte Fantasia M.D.   On: 07-01-2020 06:50   DG Chest Port 1 View  Addendum Date: 01-Jul-2020   ADDENDUM REPORT: 2020-07-01 05:57 ADDENDUM: These results were called by telephone at the time of interpretation on 07-01-2020 at 5:56 am to provider RN Marta Antu, who verbally acknowledged these results and relayed to the ordering provider. Electronically Signed   By: Lovena Le M.D.   On: 2020/07/01 05:57   Result Date: 01-Jul-2020 CLINICAL DATA:  Intubation EXAM: PORTABLE CHEST 1 VIEW COMPARISON:  CT 09/12/2018, radiograph 09/12/2018 FINDINGS: Endotracheal tube tip is low within the trachea approximating the level of the carina. Recommend retraction 3-4 cm. Transesophageal tube tip terminates within the mid to lower thoracic  esophagus. Recommend advancing approximately 20 cm for optimal function. Telemetry leads and pacer pads overlie the chest. Low volumes and atelectasis with additional bandlike areas of platelike/subsegmental atelectatic change. Enlarged cardiac silhouette compatible with cardiomegaly seen on comparison studies, with some slight accentuation  due to the portable technique. Remaining cardiomediastinal contours are unremarkable. No pneumothorax. No effusion. No other acute osseous or soft tissue abnormality. IMPRESSION: 1. Endotracheal tube tip is low within the trachea approximating the level of the carina. Recommend retraction 3-4 cm. 2. Transesophageal tube tip terminates within the mid to lower thoracic esophagus. Recommend advancing approximately 20 cm for optimal function. 3. Stable cardiomegaly. 4. Low volumes and atelectasis. Currently attempting to contact the ordering provider with a critical value result. Addendum will be submitted upon case discussion. Electronically Signed: By: Lovena Le M.D. On: 07-09-2020 05:48    Procedures Procedure Name: Intubation Date/Time: 2020/07/09 5:29 AM Performed by: Orpah Greek, MD Pre-anesthesia Checklist: Patient identified, Emergency Drugs available, Suction available, Patient being monitored and Timeout performed Oxygen Delivery Method: Ambu bag Preoxygenation: Pre-oxygenation with 100% oxygen Ventilation: Mask ventilation without difficulty Laryngoscope Size: Glidescope and 3 Grade View: Grade I Tube size: 7.5 mm Number of attempts: 1 Placement Confirmation: ETT inserted through vocal cords under direct vision,  CO2 detector and Breath sounds checked- equal and bilateral Secured at: 27 cm Tube secured with: ETT holder Dental Injury: Teeth and Oropharynx as per pre-operative assessment         Medications Ordered in ED Medications  sodium bicarbonate 150 mEq in dextrose 5 % 1,000 mL infusion (has no administration in time range)   EPINEPHrine (ADRENALIN) 1 MG/10ML injection (1 mg Intravenous Given 09-Jul-2020 0532)  sodium bicarbonate injection (100 mEq Intravenous Given 09-Jul-2020 0519)  0.9 %  sodium chloride infusion ( Intravenous Stopped 07-09-2020 0644)  EPINEPHrine (ADRENALIN) 1 MG/10ML injection (1 mg Intravenous Given 07-09-2020 0550)  EPINEPhrine 10 mcg/mL Adult IV Push Syringe (For Blood Pressure Support) (20 mcg Intravenous Given 07/09/2020 0536)  sodium bicarbonate injection (100 mEq Intravenous Given 2020/07/09 0617)  EPINEPHrine (ADRENALIN) 1 MG/10ML injection (1 mg Intravenous Given Jul 09, 2020 0620)  iohexol (OMNIPAQUE) 350 MG/ML injection 100 mL (100 mLs Intravenous Contrast Given 2020/07/09 2951)    ED Course  I have reviewed the triage vital signs and the nursing notes.  Pertinent labs & imaging results that were available during my care of the patient were reviewed by me and considered in my medical decision making (see chart for details).    MDM Rules/Calculators/A&P                          Patient presented to the emergency department after extended prehospital code.  He was intubated by myself at arrival without difficulty.  At arrival to the ER he did have ROSC.  Patient noted to have intermittent PEA and was given multiple doses of epi.  Each time he got an epi push, he would regain ROSC but eventually heart rate would slow down and pulses would be lost again.  Each time this occurred patient had CPR initiated.  Patient given multiple doses of bicarb as well for acidosis.  Despite being on high-dose epi drip, could not maintain spontaneous circulation.  Patient's wife at the bedside for at least the last hour of the coding process.  Eventually patient lost ability to respond to epi pushes.    Initial EKG at arrival did not show a STEMI.  PE was considered a possibility.  When he was stabilized enough to go to radiology, CT angiography of chest was performed.  No PE was seen.    Patient in the ED greater than 1-1/2  hours, being coded or constantly attended continuously.  Ultimately patient declared dead  at 06 42.  Medical examiner contacted and will review the case.  CRITICAL CARE Performed by: Orpah Greek   Total critical care time: 75 minutes  Critical care time was exclusive of separately billable procedures and treating other patients.  Critical care was necessary to treat or prevent imminent or life-threatening deterioration.  Critical care was time spent personally by me on the following activities: development of treatment plan with patient and/or surrogate as well as nursing, discussions with consultants, evaluation of patient's response to treatment, examination of patient, obtaining history from patient or surrogate, ordering and performing treatments and interventions, ordering and review of laboratory studies, ordering and review of radiographic studies, pulse oximetry and re-evaluation of patient's condition.  Final Clinical Impression(s) / ED Diagnoses Final diagnoses:  Cardiopulmonary arrest [facility])    Rx / DC Orders ED Discharge Orders    None       Maylene Crocker, Gwenyth Allegra, MD June 28, 2020 [phone]

## 2020-06-26 NOTE — Code Documentation (Signed)
X ray

## 2020-06-26 NOTE — Code Documentation (Signed)
Patrick Holiday, MD updating family

## 2020-06-26 NOTE — Code Documentation (Signed)
Family updated as to patient's status and at bedside  

## 2020-06-26 NOTE — Code Documentation (Signed)
Pt transported to scanner

## 2020-06-26 NOTE — Code Documentation (Signed)
Pt arrived via EMS. Per EMS pt last seen normal 3-4 hours prior to arrest. EMS stated pt was watching game with family member then went to sleep. Pt was heard snoring an when family went to check back on them they were unresponsive. Family performed about 5 mins of CPR on pt prior to arrival of EMS. Per EMS pt had heavy ETOH tonight. Pt in PEA while in ambulance but gained pulse when coming though hospital doors.    8 epi, 800ML NSS, 135BS

## 2020-06-26 NOTE — Code Documentation (Signed)
Transported back to room from CT

## 2020-06-26 NOTE — Code Documentation (Signed)
Radiology called informing:  ETT Placement

## 2020-06-26 NOTE — Code Documentation (Signed)
Pollina, MD performing bedside echo

## 2020-06-26 NOTE — Code Documentation (Signed)
Patient time of death occurred at 06:42:57.

## 2020-06-26 NOTE — Code Documentation (Signed)
Family at bedside. 

## 2020-06-26 DEATH — deceased

## 2020-07-08 ENCOUNTER — Telehealth: Payer: Self-pay

## 2020-07-08 NOTE — Telephone Encounter (Signed)
Pts wife called. Pt passed away at the end of 2022-07-12. She is asking for a call from [place]. She did not give details.

## 2020-07-14 NOTE — Telephone Encounter (Signed)
I expressed my condolences to Ms. Brazzle. She is concerned about her son's risk of cardiovascular disease and sudden death, due to recent sudden death of her husband. Brendt's paternal uncle and cousin died in mid 89s due to CAD/MI. Cleaven's father and mother have no known CAD to her knowledge. She states Her Son is now 46yrs old, he is not obese, does not play any sports, and overall healthy to her knowledge. I advised her to schedule any appt with his pediatrician for a complete physical. She verbalized understanding. She was also wanted to know results of radiology report and cause of death. I informed about the CXR, CT head and CT chest results. I advised her that I do not have the medical examiner report, therefore I do not know the cause of death at this time. I informed her that I will ask inquire on how and when she might get the report.  She verbalized understanding. She verbalized anger and sadness due to his unexpected death.I encouraged her to schedule an appt with therapist for grief counseling, as well support from close family members and friends. I provided information about the [facility] services. She verbalized understanding.

## 2020-10-27 ENCOUNTER — Ambulatory Visit: Payer: Managed Care, Other (non HMO) | Admitting: Cardiology

## 2020-11-13 ENCOUNTER — Encounter: Payer: Managed Care, Other (non HMO) | Admitting: Nurse Practitioner

## 2021-10-26 IMAGING — CT CT HEAD W/O CM
4 series · 16 of 47 positions shown, 18 images · non-contrast
Comparison: 09/01/2015

CLINICAL DATA: Mental status change.  Unresponsive.

EXAM:
CT HEAD WITHOUT CONTRAST
TECHNIQUE: Contiguous axial images were obtained from the base of the skull
through the vertex without intravenous contrast.

[Series 3: head wo · axial · 0.46mm/px · z∈[+71,+191]mm · 7 of 33 slices shown, 9 images]
[im 5/33  brain]
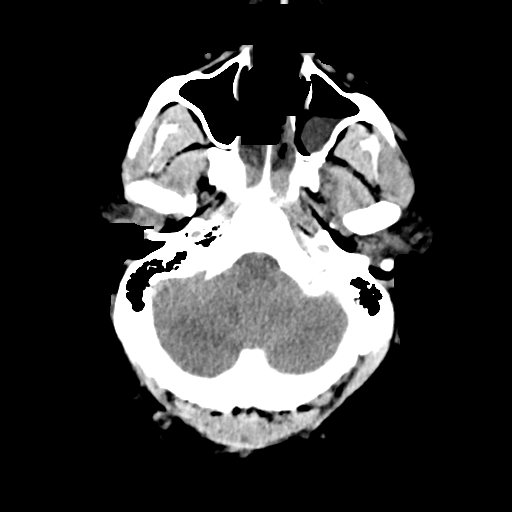
[im 5/33  bone]
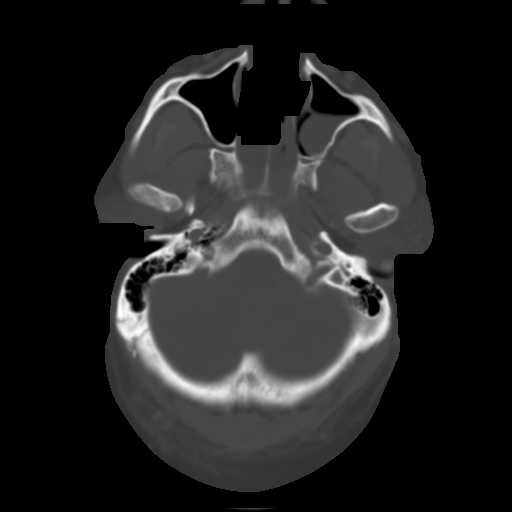
[im 9/33  brain]
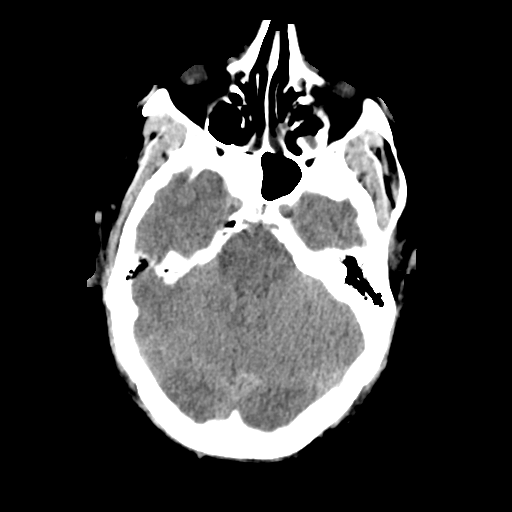
[im 13/33  brain]
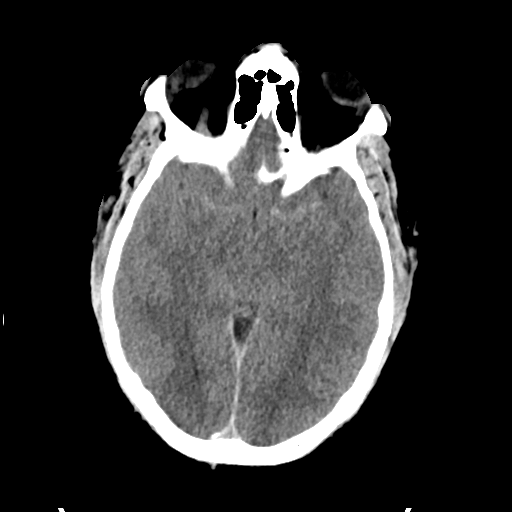
[im 17/33  brain]
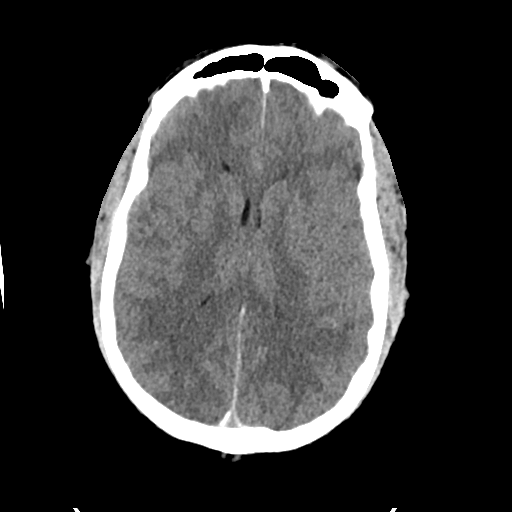
[im 21/33  brain]
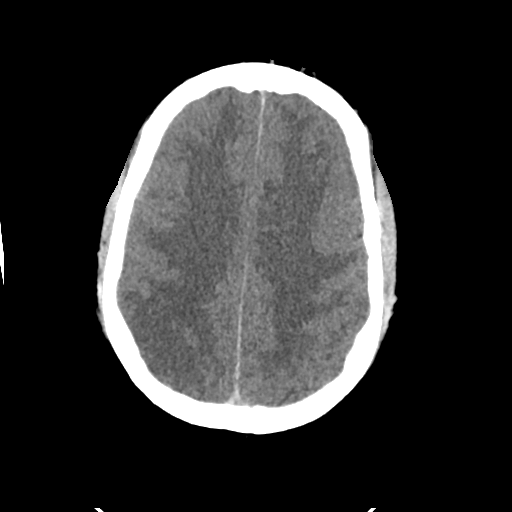
[im 21/33  bone]
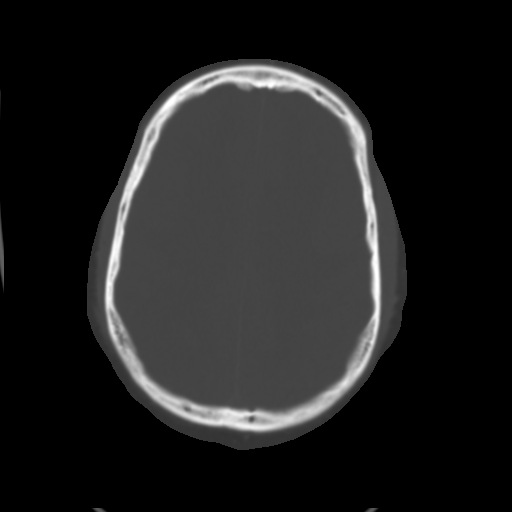
[im 25/33  brain]
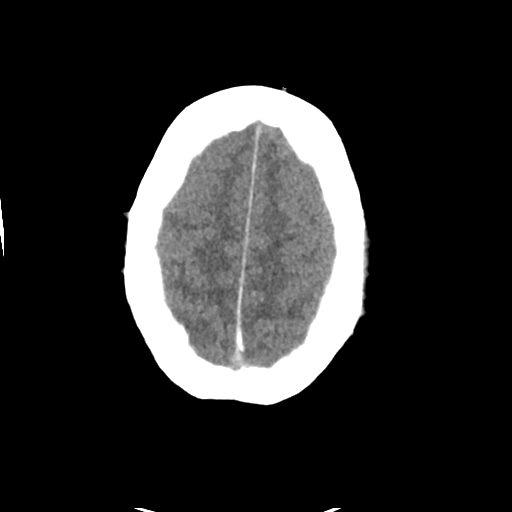
[im 29/33  brain]
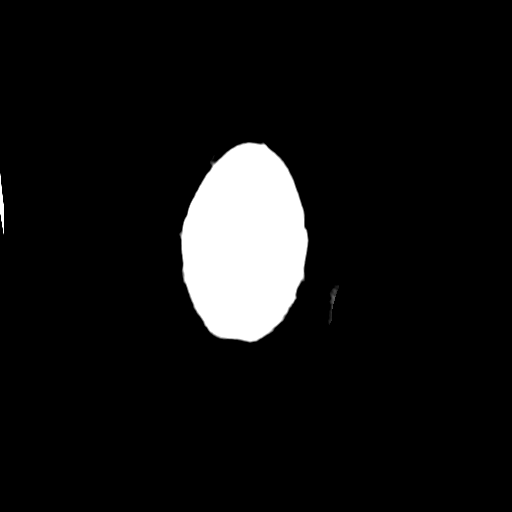

[Series 4: head bone · axial · 0.46mm/px · z∈[+67,+99]mm · 3 of 82 slices shown]
[im 9/82  bone]
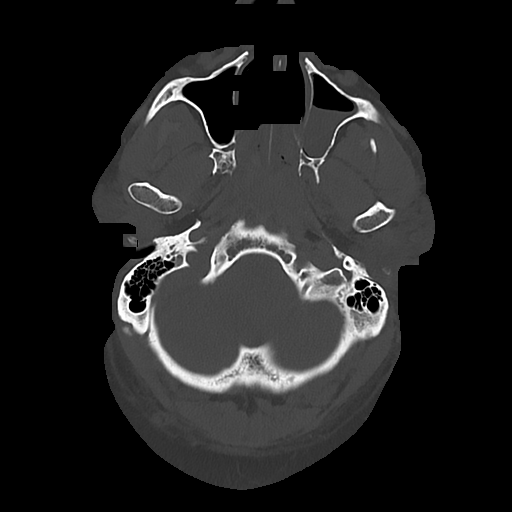
[im 17/82  bone]
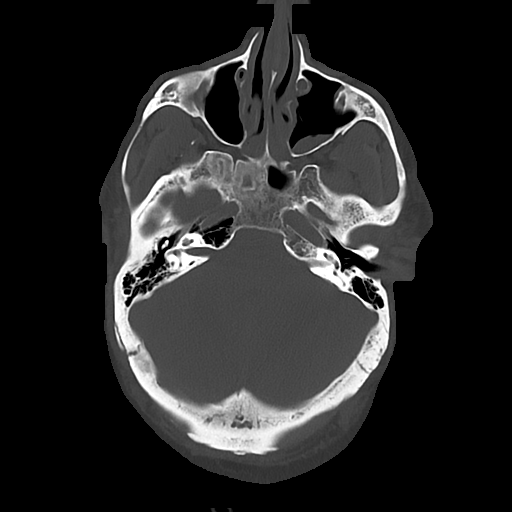
[im 25/82  bone]
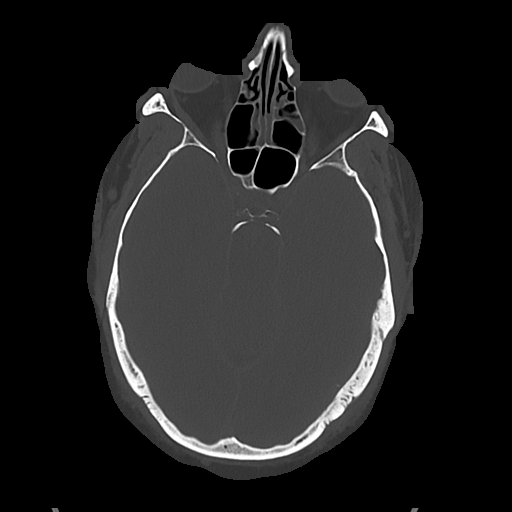

[Series 5: cor soft · coronal · 0.32mm/px · 3 of 75 slices shown]
[im 25/75  brain]
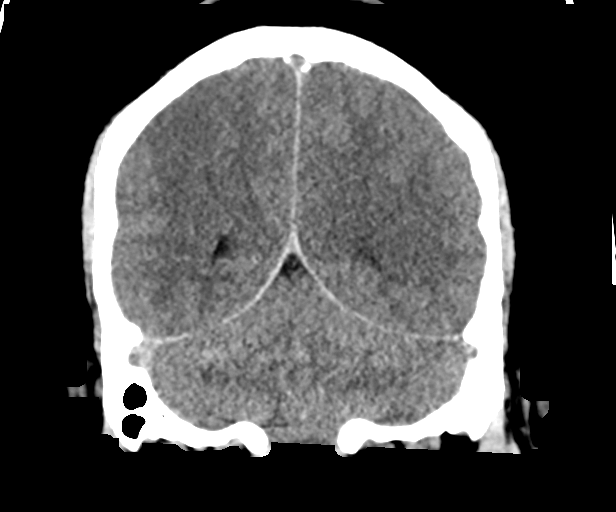
[im 33/75  brain]
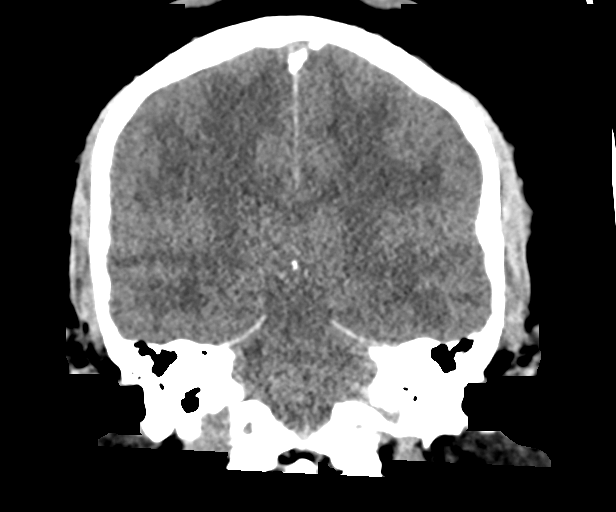
[im 42/75  brain]
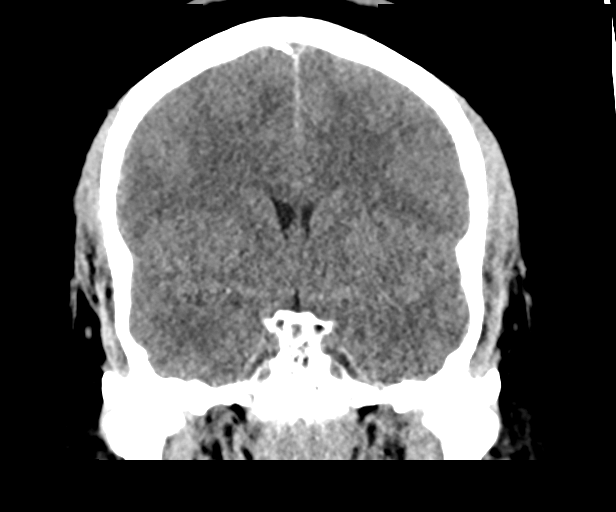

[Series 6: sag soft · sagittal · 0.32mm/px · 3 of 65 slices shown]
[im 22/65  brain]
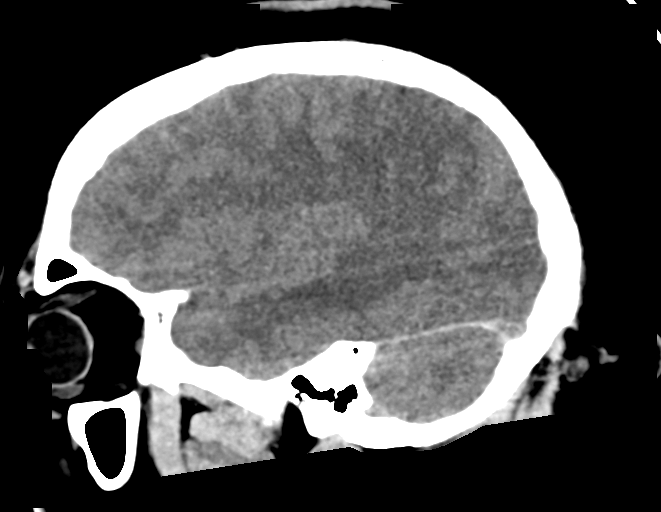
[im 33/65  brain]
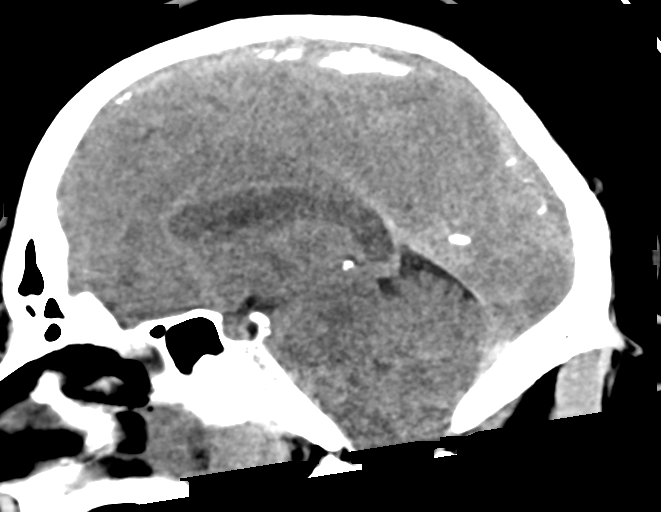
[im 43/65  brain]
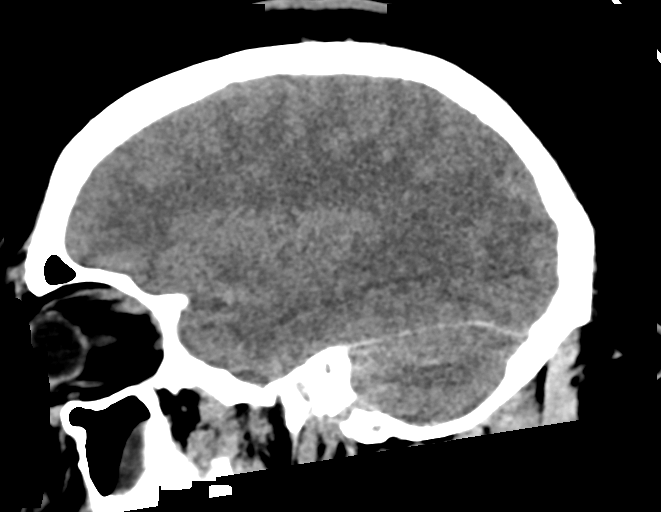

[16 of 47 positions shown; findings below may reference images not displayed]

FINDINGS: Brain: Generalized brain swelling with loss of sulci and basal
cisterns paired to prior. There is crowding at the foramen magnum
and tentorial incisura. A discrete infarct appearance is seen along
the right parietal cortex. No hemorrhage, hydrocephalus, or
collection.

Vascular: No hyperdense vessel when allowing for presumed adjacent
brain edema.

Skull: Negative

Sinuses/Orbits: Partial opacification of the left maxillary sinus,
chronic.
IMPRESSION: 1. Generalized brain swelling with downward central descent and
crowding at the foramen magnum.
2. Superimposed discrete acute infarct of the right parietal cortex.

## 2021-10-26 IMAGING — CT CT ANGIO CHEST
2 of 7 series · 18 of 46 positions shown · IV contrast (APPLIED)
Comparison: 09/11/2017

CLINICAL DATA: High probability for pulmonary embolism.

EXAM:
CT ANGIOGRAPHY CHEST WITH CONTRAST
TECHNIQUE: Multidetector CT imaging of the chest was performed using the
standard protocol during bolus administration of intravenous
contrast. Multiplanar CT image reconstructions and MIPs were
obtained to evaluate the vascular anatomy.
CONTRAST:  100mL OMNIPAQUE IOHEXOL 350 MG/ML SOLN

[Series 8: thins · axial · 0.64mm/px · z∈[-221,+5]mm · 15 of 364 slices shown]
[im 21/364  lung]
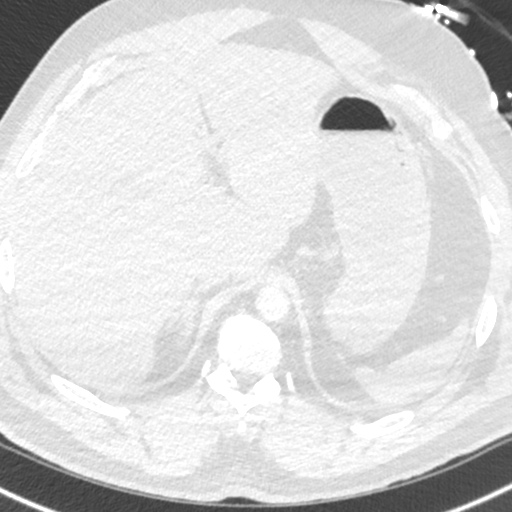
[im 41/364  soft-tissue]
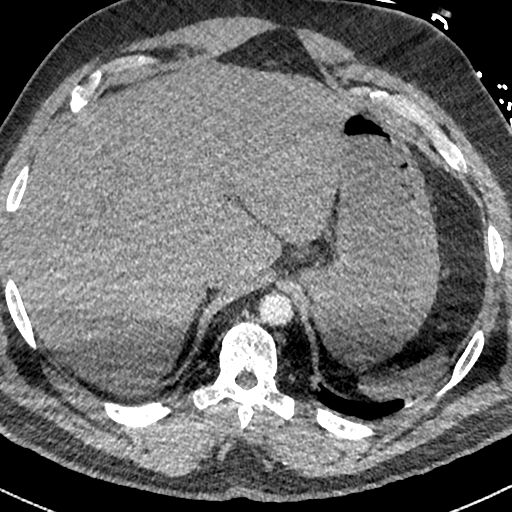
[im 61/364  lung]
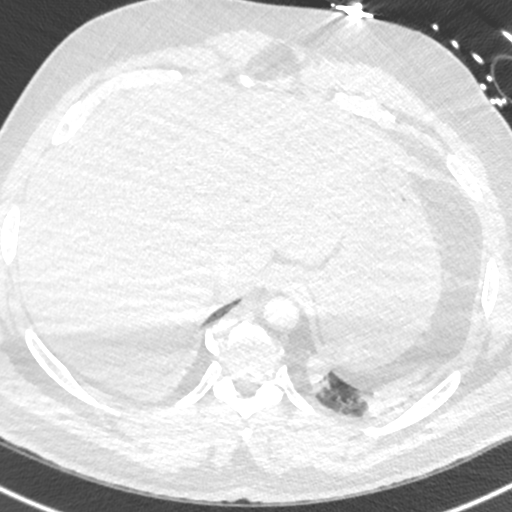
[im 81/364  soft-tissue]
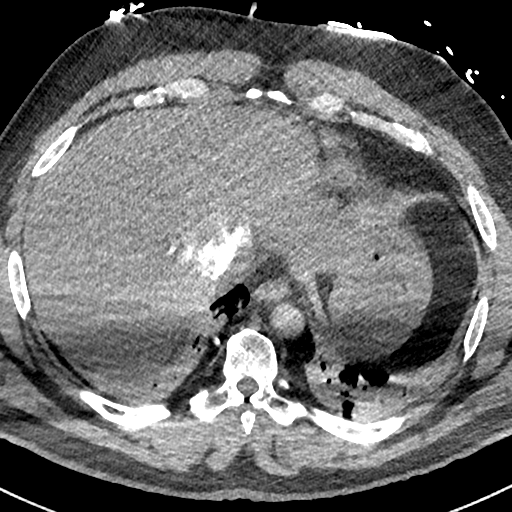
[im 122/364  lung]
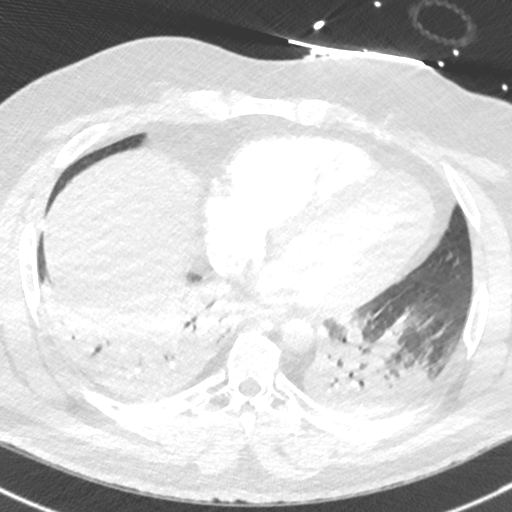
[im 142/364  soft-tissue]
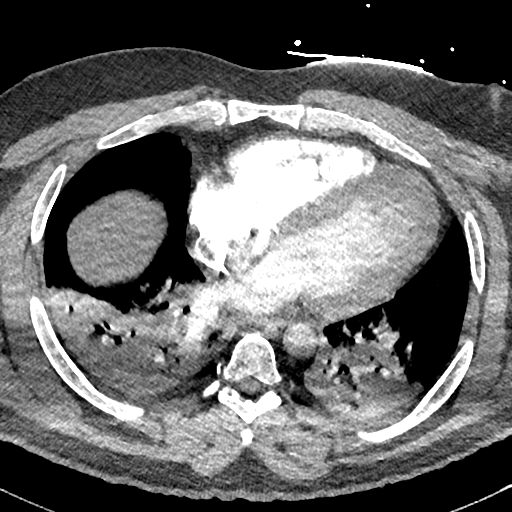
[im 162/364  lung]
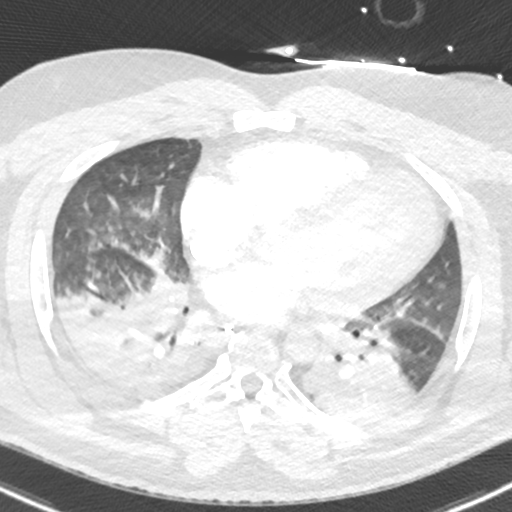
[im 182/364  soft-tissue]
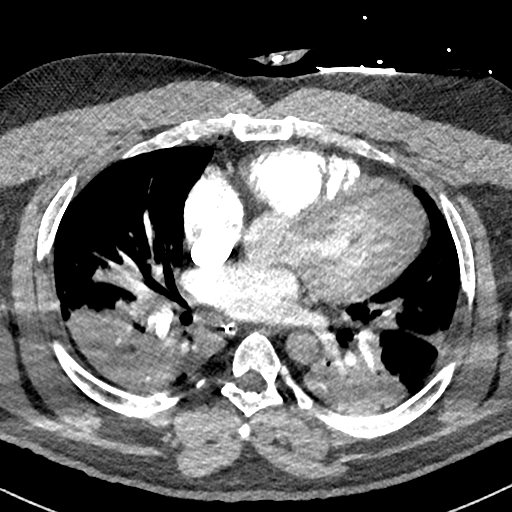
[im 202/364  lung]
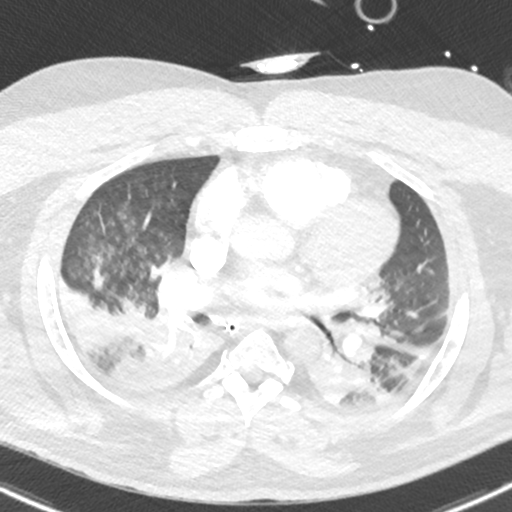
[im 222/364  soft-tissue]
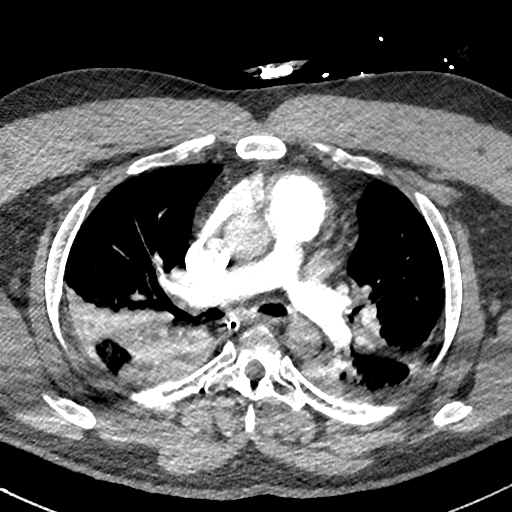
[im 243/364  lung]
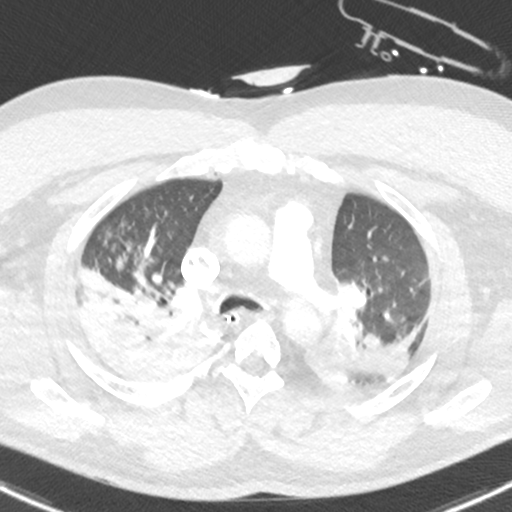
[im 283/364  soft-tissue]
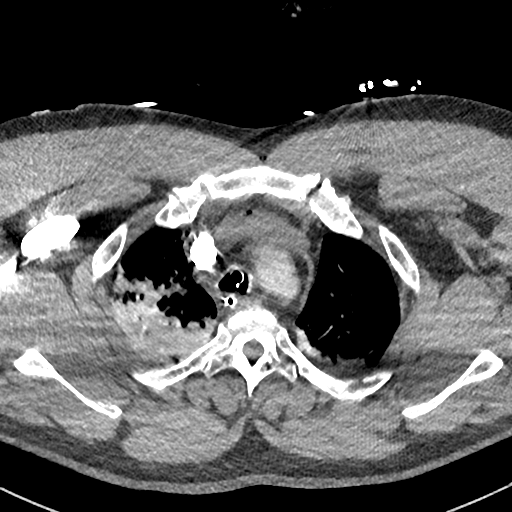
[im 303/364  lung]
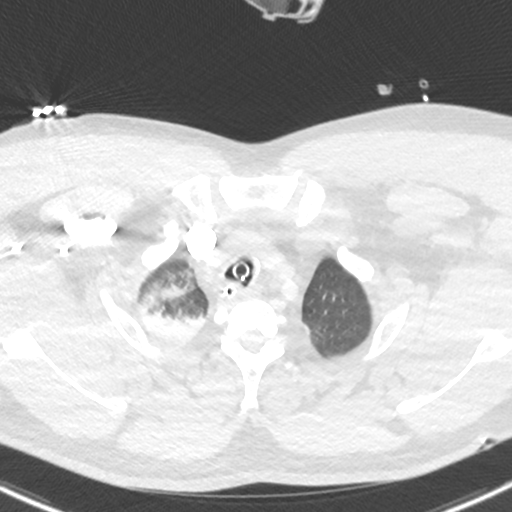
[im 323/364  soft-tissue]
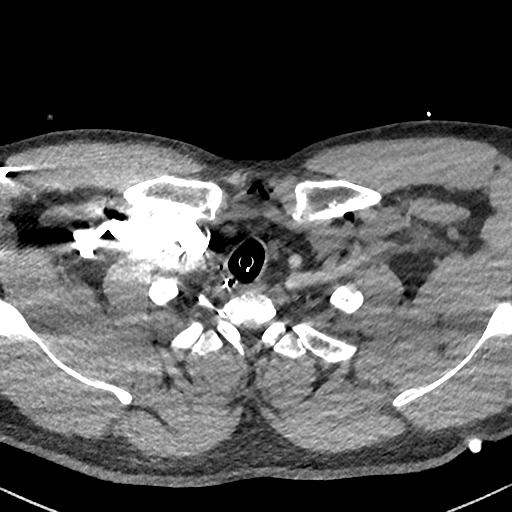
[im 343/364  lung]
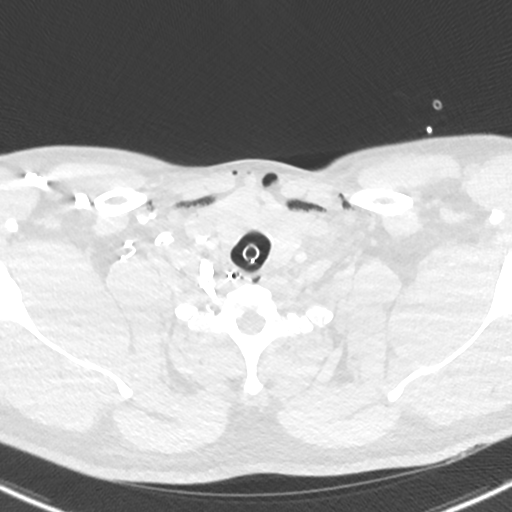

[Series 9: cor · coronal · 0.53mm/px · 3 of 169 slices shown]
[im 43/169  soft-tissue]
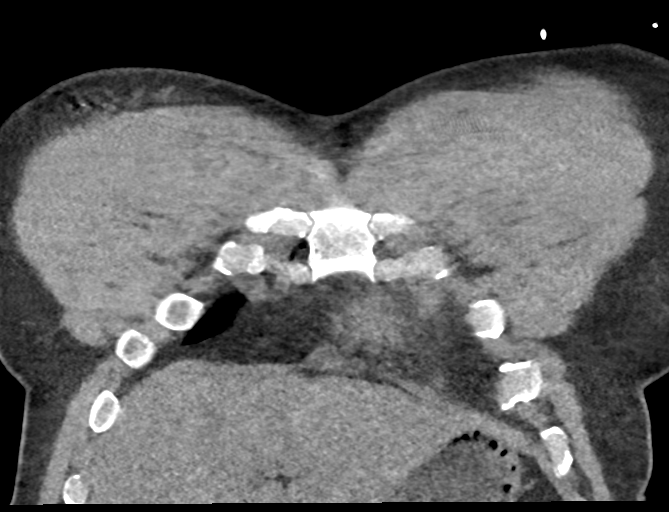
[im 85/169  soft-tissue]
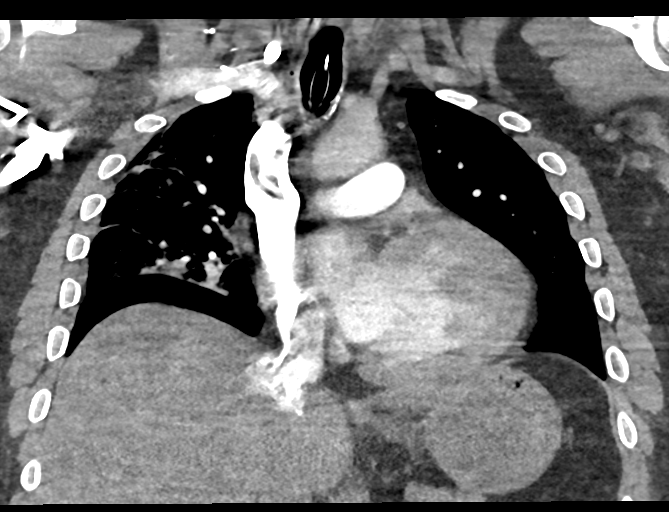
[im 127/169  soft-tissue]
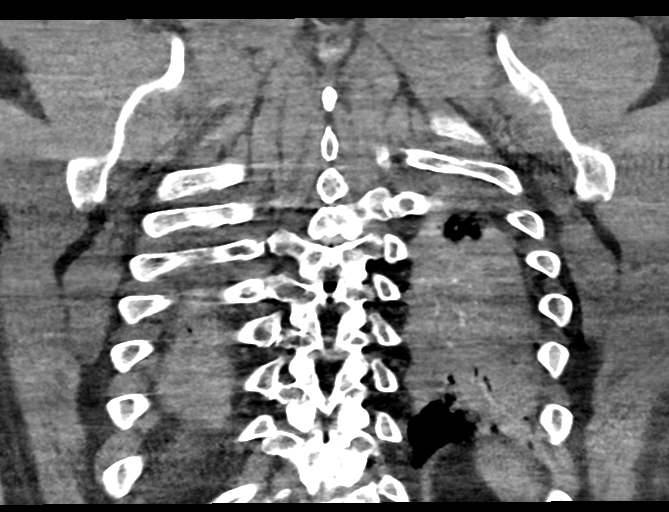

[18 of 46 positions shown; findings below may reference images not displayed]

FINDINGS: Cardiovascular: Satisfactory opacification of the pulmonary arteries
to the segmental level. No evidence of pulmonary embolism. Enlarged
heart size. No pericardial effusion.

Mediastinum/Nodes: Negative for adenopathy or mass.

Endotracheal tube which terminates above the carina. The enteric
tube tip terminates in the mid to lower thoracic esophagus. This
finding has already been called; will not repeat call report to
avoid two manipulations without interval imaging.

Intravenous gas attributed to IV access.

Lungs/Pleura: Dense dependent airspace opacity with volume loss. No
superimposed edema, effusion, or air leak.

Upper Abdomen: Negative

Musculoskeletal: Anterior right 6 rib fracture which is
nondisplaced. Possible cartilage fracture the anterior left seventh
rib. Spondylosis.

Review of the MIP images confirms the above findings.
IMPRESSION: 1. Negative for pulmonary embolism.
2. Extensive airspace disease with aspiration pattern.
3. Cardiomegaly.
4. Nondisplaced right anterior sixth rib fracture. Probable left
seventh rib cartilage fracture.
5. As noted on preceding chest x-ray the enteric tube tip is at the
thoracic esophagus.
